# Patient Record
Sex: Female | Born: 1960 | Race: White | Hispanic: No | Marital: Married | Smoking: Never smoker
Health system: Southern US, Community
[De-identification: ages and names within clinical notes are randomized; demographics above are authoritative.]

## PROBLEM LIST (undated history)

## (undated) DIAGNOSIS — Z9889 Other specified postprocedural states: Secondary | ICD-10-CM

## (undated) DIAGNOSIS — N838 Other noninflammatory disorders of ovary, fallopian tube and broad ligament: Secondary | ICD-10-CM

## (undated) DIAGNOSIS — I10 Essential (primary) hypertension: Secondary | ICD-10-CM

## (undated) DIAGNOSIS — E7800 Pure hypercholesterolemia, unspecified: Secondary | ICD-10-CM

## (undated) DIAGNOSIS — E079 Disorder of thyroid, unspecified: Secondary | ICD-10-CM

## (undated) HISTORY — DX: Pure hypercholesterolemia, unspecified: E78.00

## (undated) HISTORY — DX: Disorder of thyroid, unspecified: E07.9

## (undated) HISTORY — DX: Essential (primary) hypertension: I10

## (undated) HISTORY — DX: Other specified postprocedural states: Z98.890

## (undated) HISTORY — DX: Other noninflammatory disorders of ovary, fallopian tube and broad ligament: N83.8

## (undated) HISTORY — PX: JOINT REPLACEMENT: SHX530

## (undated) HISTORY — PX: PILONIDAL CYST DRAINAGE: SHX743

---

## 1998-04-22 ENCOUNTER — Other Ambulatory Visit: Admission: RE | Admit: 1998-04-22 | Discharge: 1998-04-22 | Payer: Self-pay | Admitting: Gynecology

## 1999-06-15 ENCOUNTER — Other Ambulatory Visit: Admission: RE | Admit: 1999-06-15 | Discharge: 1999-06-15 | Payer: Self-pay | Admitting: Gynecology

## 2000-04-04 ENCOUNTER — Encounter: Payer: Self-pay | Admitting: Gynecology

## 2000-04-04 ENCOUNTER — Ambulatory Visit (HOSPITAL_COMMUNITY): Admission: RE | Admit: 2000-04-04 | Discharge: 2000-04-04 | Payer: Self-pay | Admitting: Gynecology

## 2000-06-15 ENCOUNTER — Other Ambulatory Visit: Admission: RE | Admit: 2000-06-15 | Discharge: 2000-06-15 | Payer: Self-pay | Admitting: Gynecology

## 2000-07-26 ENCOUNTER — Encounter: Payer: Self-pay | Admitting: Family Medicine

## 2000-07-26 ENCOUNTER — Encounter: Admission: RE | Admit: 2000-07-26 | Discharge: 2000-07-26 | Payer: Self-pay | Admitting: Family Medicine

## 2001-06-17 ENCOUNTER — Other Ambulatory Visit: Admission: RE | Admit: 2001-06-17 | Discharge: 2001-06-17 | Payer: Self-pay | Admitting: Gynecology

## 2002-07-03 ENCOUNTER — Other Ambulatory Visit: Admission: RE | Admit: 2002-07-03 | Discharge: 2002-07-03 | Payer: Self-pay | Admitting: Gynecology

## 2002-12-20 ENCOUNTER — Emergency Department (HOSPITAL_COMMUNITY): Admission: AD | Admit: 2002-12-20 | Discharge: 2002-12-20 | Payer: Self-pay | Admitting: Emergency Medicine

## 2002-12-20 ENCOUNTER — Encounter: Payer: Self-pay | Admitting: Emergency Medicine

## 2003-04-10 ENCOUNTER — Encounter: Admission: RE | Admit: 2003-04-10 | Discharge: 2003-04-10 | Payer: Self-pay | Admitting: Gynecology

## 2003-04-10 ENCOUNTER — Encounter: Payer: Self-pay | Admitting: Gynecology

## 2003-07-27 ENCOUNTER — Other Ambulatory Visit: Admission: RE | Admit: 2003-07-27 | Discharge: 2003-07-27 | Payer: Self-pay | Admitting: Gynecology

## 2004-04-06 ENCOUNTER — Encounter: Admission: RE | Admit: 2004-04-06 | Discharge: 2004-04-06 | Payer: Self-pay | Admitting: Gynecology

## 2004-08-01 ENCOUNTER — Other Ambulatory Visit: Admission: RE | Admit: 2004-08-01 | Discharge: 2004-08-01 | Payer: Self-pay | Admitting: Gynecology

## 2004-08-17 HISTORY — PX: HIP SURGERY: SHX245

## 2004-09-14 ENCOUNTER — Inpatient Hospital Stay (HOSPITAL_COMMUNITY): Admission: RE | Admit: 2004-09-14 | Discharge: 2004-09-17 | Payer: Self-pay | Admitting: Orthopedic Surgery

## 2004-11-09 ENCOUNTER — Other Ambulatory Visit: Admission: RE | Admit: 2004-11-09 | Discharge: 2004-11-09 | Payer: Self-pay | Admitting: Gynecology

## 2005-04-20 ENCOUNTER — Encounter: Admission: RE | Admit: 2005-04-20 | Discharge: 2005-04-20 | Payer: Self-pay | Admitting: Gynecology

## 2005-06-19 DIAGNOSIS — N838 Other noninflammatory disorders of ovary, fallopian tube and broad ligament: Secondary | ICD-10-CM

## 2005-06-19 HISTORY — DX: Other noninflammatory disorders of ovary, fallopian tube and broad ligament: N83.8

## 2005-12-18 ENCOUNTER — Other Ambulatory Visit: Admission: RE | Admit: 2005-12-18 | Discharge: 2005-12-18 | Payer: Self-pay | Admitting: Gynecology

## 2006-03-19 DIAGNOSIS — Z9889 Other specified postprocedural states: Secondary | ICD-10-CM

## 2006-03-19 HISTORY — DX: Other specified postprocedural states: Z98.890

## 2006-04-18 ENCOUNTER — Encounter (INDEPENDENT_AMBULATORY_CARE_PROVIDER_SITE_OTHER): Payer: Self-pay | Admitting: Specialist

## 2006-04-18 ENCOUNTER — Ambulatory Visit (HOSPITAL_COMMUNITY): Admission: RE | Admit: 2006-04-18 | Discharge: 2006-04-18 | Payer: Self-pay | Admitting: Gynecology

## 2006-04-18 HISTORY — PX: HYSTEROSCOPY: SHX211

## 2006-04-18 HISTORY — PX: ENDOMETRIAL ABLATION: SHX621

## 2006-05-01 ENCOUNTER — Encounter: Admission: RE | Admit: 2006-05-01 | Discharge: 2006-05-01 | Payer: Self-pay | Admitting: Gynecology

## 2006-12-20 ENCOUNTER — Other Ambulatory Visit: Admission: RE | Admit: 2006-12-20 | Discharge: 2006-12-20 | Payer: Self-pay | Admitting: Gynecology

## 2006-12-27 ENCOUNTER — Encounter: Admission: RE | Admit: 2006-12-27 | Discharge: 2006-12-27 | Payer: Self-pay | Admitting: Family Medicine

## 2007-05-03 ENCOUNTER — Encounter: Admission: RE | Admit: 2007-05-03 | Discharge: 2007-05-03 | Payer: Self-pay | Admitting: Gynecology

## 2007-05-09 ENCOUNTER — Encounter: Admission: RE | Admit: 2007-05-09 | Discharge: 2007-05-09 | Payer: Self-pay | Admitting: Gynecology

## 2007-05-21 ENCOUNTER — Encounter: Admission: RE | Admit: 2007-05-21 | Discharge: 2007-05-21 | Payer: Self-pay | Admitting: Gynecology

## 2007-06-20 DIAGNOSIS — N838 Other noninflammatory disorders of ovary, fallopian tube and broad ligament: Secondary | ICD-10-CM | POA: Insufficient documentation

## 2007-12-27 ENCOUNTER — Other Ambulatory Visit: Admission: RE | Admit: 2007-12-27 | Discharge: 2007-12-27 | Payer: Self-pay | Admitting: Gynecology

## 2008-05-04 ENCOUNTER — Encounter: Admission: RE | Admit: 2008-05-04 | Discharge: 2008-05-04 | Payer: Self-pay | Admitting: Gynecology

## 2008-05-18 ENCOUNTER — Encounter: Admission: RE | Admit: 2008-05-18 | Discharge: 2008-05-18 | Payer: Self-pay | Admitting: Gynecology

## 2008-06-25 ENCOUNTER — Ambulatory Visit: Payer: Self-pay | Admitting: Gynecology

## 2008-11-17 ENCOUNTER — Encounter: Admission: RE | Admit: 2008-11-17 | Discharge: 2008-11-17 | Payer: Self-pay | Admitting: Gynecology

## 2008-12-28 ENCOUNTER — Encounter: Payer: Self-pay | Admitting: Gynecology

## 2008-12-28 ENCOUNTER — Other Ambulatory Visit: Admission: RE | Admit: 2008-12-28 | Discharge: 2008-12-28 | Payer: Self-pay | Admitting: Gynecology

## 2008-12-28 ENCOUNTER — Ambulatory Visit: Payer: Self-pay | Admitting: Gynecology

## 2008-12-30 ENCOUNTER — Ambulatory Visit: Payer: Self-pay | Admitting: Gynecology

## 2009-05-05 ENCOUNTER — Encounter: Admission: RE | Admit: 2009-05-05 | Discharge: 2009-05-05 | Payer: Self-pay | Admitting: Gynecology

## 2010-01-04 ENCOUNTER — Ambulatory Visit: Payer: Self-pay | Admitting: Gynecology

## 2010-01-04 ENCOUNTER — Other Ambulatory Visit: Admission: RE | Admit: 2010-01-04 | Discharge: 2010-01-04 | Payer: Self-pay | Admitting: Gynecology

## 2010-01-17 ENCOUNTER — Ambulatory Visit: Payer: Self-pay | Admitting: Gynecology

## 2010-05-06 ENCOUNTER — Encounter: Admission: RE | Admit: 2010-05-06 | Discharge: 2010-05-06 | Payer: Self-pay | Admitting: Gynecology

## 2010-11-04 NOTE — H&P (Signed)
Mary Hughes, BUTZER              ACCOUNT NO.:  [id number]   MEDICAL RECORD NO.:  [id number]          PATIENT TYPE:  AMB   LOCATION:  SDC                           FACILITY:  [facility]   PHYSICIAN:  Timothy P. Fontaine, M.D.DATE OF BIRTH:  [birth year]   DATE OF ADMISSION:  04/18/2006  DATE OF DISCHARGE:                                HISTORY & PHYSICAL   CHIEF COMPLAINT:  Menorrhagia.   HISTORY OF PRESENT ILLNESS:  A 50 year old G2, P2, female, vasectomy birth  control, who presents complaining of increasing menorrhagia that is becoming  unacceptable.  Outpatient evaluation includes a normal thyroid, hemoglobin  of 12, and a sonohysterogram which reveals an endometrial polyp, possibly  two endometrial polyps.  The patient is admitted at this time for  hysteroscopy, removal of her polyps, D&C, endometrial ablation, NovaSure  technique.   PAST MEDICAL HISTORY:  Premenstrual anxiety.   PAST SURGICAL HISTORY:  1. Left hip replacement March 2006.  2. Pilonidal cyst x4.   MEDICATIONS:  Sarafem 10 mg daily, multivitamin.   ALLERGIES:  No medications.   REVIEW OF SYSTEMS:  Noncontributory.   FAMILY HISTORY:  Noncontributory.   SOCIAL HISTORY:  Noncontributory.   PHYSICAL EXAMINATION:  VITAL SIGNS:  Afebrile.  Vital signs are stable.  HEENT: Normal.  LUNGS:  Lungs: Clear.  CARDIAC:  Cardiac: Regular rate.  No rubs, murmurs or gallops.  ABDOMEN:  Benign.  PELVIC:  External, BUS, vagina normal.  Cervix normal.  Uterus normal size,  midline mobile, nontender.  Adnexa without masses or tenderness.   ASSESSMENT AND PLAN:  A 50 year old G2, P2, female, vasectomy birth control,  increasing menorrhagia, endometrial polyps, for hysteroscopic resection,  removal of her polyps D&C, NovaSure endometrial ablation.  Options for  removal of the polyps, monitoring her cycles, following clinically  afterwards versus going ahead with the ablation at this time were reviewed,  the various scenarios  discussed.  The patient understands that if the polyps  show significant atypia that we may need to proceed with more involved  surgery following the procedure to include hysterectomy or re-sampling, and  the patient is comfortable with this and she wants to proceed with ablation  realizing we do not have the pathology back yet at the time of that  procedure.  The long-term issues with ablation were reviewed.  She  understands she should never achieve pregnancy following the procedure and  that she needs to use absolute contraception following the procedure.  She  does have a hip replacement and does use amoxicillin antibiotic  prophylactically dental work.  We are going to go ahead and cover her with  IV ampicillin preop, and she will take a postoperative 6-hour amoxicillin 2  g dose.  The procedure was discussed to include the use of the hysteroscope,  resectoscope, D&C, and the use of the NovaSure endometrial ablation  equipment.  She understands there are no guarantees as far as bleeding  relief, that her menorrhagia may continue, change or worsen following the  procedure, and she understands and accepts this.  The acute risks to include  infection, prolonged antibiotics, hemorrhage  necessitating transfusion and  the risks of transfusion including transfusion reaction, hepatitis, HIV, mad  cow disease and other unknown entities, was all discussed, understood and  accepted.  The risk of inadvertent injury to internal organs, both through  direct injury and uterine perforation as well as transuterine thermal damage  due to the NovaSure causing internal organ damage, including bowel, bladder,  ureters, vessels and nerves, necessitating major exploratory or reparative  surgeries, future reparative surgeries including ostomy formation, was all  discussed, understood and accepted.  The use of a distention medium with the  hysteroscopy was discussed and the possibilities of absorption,  metabolic  complications, coma, seizure, were all reviewed with her.  The patient's  questions were answered to her satisfaction.  She is ready to proceed with  surgery.      Timothy P. Fontaine, M.D.  Electronically Signed     TPF/MEDQ  D:  04/11/2006  T:  04/12/2006  Job:  [id number]

## 2010-11-04 NOTE — Discharge Summary (Signed)
NAMENATISHA, TRZCINSKI NO.:  [id number]   MEDICAL RECORD NO.:  [id number]          PATIENT TYPE:  INP   LOCATION:  0468                         FACILITY:  [facility]   PHYSICIAN:  Ollen Gross, M.D.    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   DISCHARGE DIAGNOSES:  1.  Dysplasia with secondary osteoarthritis left hip, status post left total      hip arthroplasty metal-on-metal construct.  2.  Postoperative blood loss anemia, did not require transfusion.  3.  Postoperative hyponatremia.  4.  Postoperative hypokalemia.   PROCEDURE:  September 14, 2004, left total hip arthroplasty metal-on-metal  construct.   SURGEON:  Ollen Gross, M.D.   ASSISTANT:  Alexzandrew L. Julien Girt, P.A.   ANESTHESIA:  General.   BLOOD LOSS:  Was 300 cc.   Hemovac drain x1.   BRIEF HISTORY:  Mary Hughes is a 50 year old female with dysplasia of the left hip  that has gone on to develop severe end stage arthritis.  She had a bone-on-  bone throughout the hips, failed nonoperative management, now presents for a  total hip arthroplasty.   CONSULTATIONS:  None.   LABORATORY DATA:  CBC, on admission, hemoglobin 12.5, hematocrit 37.2, white  cell count 6.5, differential within normal limits.  Hemoglobin, post-op,  8.8, went down to 8.4 but came back up to 8.8 and a crit of 25.  PT and PTT  pre-op 13 and 29, respectively, with an INR of 1.  Serial protimes followed.  Last PT and INR 19.3 and 2.  Chem panel, on admission, all within normal  limits with the exception of total bilirubin elevated mildly at 1.3.  Sodium  did drop from 138 to 134, last noted at 131.  Potassium dropped from 4.1 to  3.3, back up to 3.8.  Glucose went up from 98 to 128, back down to 111.  Urinalysis, pre-op, negative.  Blood type O positive.  Left  hip films, September 12, 2004, advanced degenerative osteoarthritic change, slight lateral  subluxation of the femoral head, underlying developmental dysplasia.  Two-  view chest, September 12, 2004, no active disease.  Postoperative hip/pelvis  films, September 14, 2004, hardware is well aligned.  No perioperative  complications.  Status post left total hip.   HOSPITAL COURSE:  Admitted to [facility] and underwent above  procedure without complication, tolerated procedure well, placed on PCA and  p.o. analgesics postoperatively, doing fairly well on day one.  Initially  the patient was told there was no pain pill ordered and she was recommended  using the PCA, but Percocet was checked off.  It was not added to the [facility].  This was added for postoperative pain control.  I encouraged her to use p.o.  meds.  She had a little bit of positive volume overload but she was young  and healthy and felt to diurese the fluid well.  Hemoglobin was 8.8  postoperatively but she was asymptomatic with the anemia.  The drain was  pulled.  On day two, the hemoglobin was a little bit lower at 8.4 but she  remained asymptomatic, had a little bit of a drop in her potassium level.  She was given potassium supplements and she responded well.  PCA and IVs  were discontinued on day two.  She did well with her physical therapy  ambulating approximately 100 feet by day two, did so well she was feeling  much better by day three.  On September 17, 2004, she was doing well with her  therapy and was discharged home.   DISCHARGE PLAN:  1.  The patient is discharged home on September 17, 2004.  2.  Discharge diagnoses, please see above.  3.  Discharge meds Percocet, Robaxin, Coumadin.  4.  Diet:  As tolerated.  5.  Activity:  Partial weightbearing 25-50% to the left lower extremity.      Gait training, ambulation, ADLs.  Home health PT and home health      nursing.  Total hip protocol.  Hip precautions.  6.  Follow up two weeks  from surgery, call the office for an appointment [phone]      [phone].   DISPOSITION:  Home.   CONDITION ON DISCHARGE:  Improved.      ALP/MEDQ  D:  10/26/2004  T:  10/26/2004  Job:  [id number]   cc:   Ollen Gross, M.D.  [facility]  [address]  [address]  [place]  [place] [address]  Fax: [phone]   Mary Hughes, M.D.  [address] [place]  [place] [address]  Fax: [phone]

## 2010-11-04 NOTE — Op Note (Signed)
NAME:  Mary Hughes, Mary Hughes NO.:  [id number]   MEDICAL RECORD NO.:  [id number]          PATIENT TYPE:  INP   LOCATION:  0001                         FACILITY:  [facility]   PHYSICIAN:  Ollen Gross, M.D.    DATE OF BIRTH:  Feb 20, 1961   DATE OF PROCEDURE:  09/14/2004  DATE OF DISCHARGE:                                 OPERATIVE REPORT   PREOPERATIVE DIAGNOSIS:  Dysplasia with secondary osteoarthritis, left hip.   POSTOPERATIVE DIAGNOSIS:  Dysplasia with second osteoarthritis, left hip.   PROCEDURE:  Left total hip arthroplasty, metal-on-metal.   SURGEON:  Ollen Gross, M.D.   ASSISTANT:  Alexzandrew L. Julien Girt, P.A.   ANESTHESIA:  General.   ESTIMATED BLOOD LOSS:  300.   DRAIN:  Hemovac x1.   COMPLICATIONS:  None.   CONDITION:  Stable to the recovery room.   BRIEF CLINICAL NOTE:  Mary Hughes is a 50 year old female who has dysplasia of the  left hip and has gone on to develop severe end-stage osteoarthritic changes.  She has bone-on-bone throughout the hip.  She has failed nonoperative  management and presents now for left total hip arthroplasty.   PROCEDURE IN DETAIL:  After successful administration of general anesthetic,  the patient was placed in the right lateral decubitus position with the left  side up and held with the hip positioner.  The left lower extremity was  isolated with a plastic drapes and prepped and draped and draped in the  usual sterile fashion.  A short posterolateral incision was made with a 10  blade through the subcutaneous tissue to the level of the fascia lata which  was incised in line with the skin incision.  The sciatic nerve was palpated  an protected, and the short rotators isolated off the femur.  Capsulectomy  was performed, and the hip was dislocated.  The center of the femoral head  was marked, and trial prosthesis placed such that the center of the trial  head corresponds to the center of her native femoral head.  The  osteotomy  midline was marked on the femoral neck and osteotomy made with an  oscillating saw.  The femoral head was removed and then the femur retracted  anteriorly to gain acetabular exposure.   Acetabular reaming starts at 47, coursing in increments of 2 up to a 53, and  then a 54-mm Pinnacle acetabular shell was placed in anatomic position and  transfixed with two dome screws.  A trial 36-mm neutral liner was placed.   The femur was prepared first with the canal finder and then irrigation.  Axial reaming was performed to 13.5 mm, then proximal reaming to a 18-D and  a sleeve machine to a small.  An 18-D small trial sleeve was placed with an  18 x 13 stem at 36+8 neck.  Her anteversion was about 40 degrees.  We  actually retroverted her in relation to her native anatomy to get a version  of approximately 25 degrees.  We placed a 36+0 head and with the +0, she had  a little bit too much soft tissue laxity, so we went  to a 36+3.  With the  +3, she had full extension, full external rotation to 70 degrees, flexion to  40 degrees, adduction to 90 degrees, internal rotation to 90 degrees flexion  and 90 degrees internal rotation.  By placing the left log on top of the  right, the leg lengths were found to be equal.  The hip was then dislocated,  and all trials were removed.  The permanent Apex hole eliminator was placed  into the acetabular shell, and a permanent 36-mm neutral Ultamet metal liner  is placed for a metal-on-metal hip replacement.  The 18-D small sleeve was  placed into the proximal femur with the 18 x 13 stem and a 36+8 neck,  version matching the version of the trial.  The 36+3 head was then placed,  and the hip was reduced with the same stability parameters.  The wound was  copiously irrigated with saline solution, and the short external rotators  were reattached to the femur through drill holes.  The fascia lata was  closed over a Hemovac drain with interrupted #1 Vicryl,  subcutaneous tissue  closed with #1 and 2-0 Vicryl and subcuticular running 4-0 Monocryl.  Thirty  cc of 0.25% Marcaine were injected into the subcutaneous tissues.  Steri-  Strips and a bulky sterile dressing were applied.  The drain was hooked to  suction.  She was placed into a knee immobilizer, awakened, and transported  to recovery in stable condition.      FA/MEDQ  D:  09/14/2004  T:  09/14/2004  Job:  [id number]

## 2010-11-04 NOTE — H&P (Signed)
NAME:  Mary Hughes, Mary Hughes NO.:  [id number]   MEDICAL RECORD NO.:  [id number]          PATIENT TYPE:  INP   LOCATION:  NA                           FACILITY:  [facility]   PHYSICIAN:  Ollen Gross, M.D.    DATE OF BIRTH:  1960/11/27   DATE OF ADMISSION:  09/14/2004  DATE OF DISCHARGE:                                HISTORY & PHYSICAL   Date of examination:  September 06, 2004.   CHIEF COMPLAINT:  Left hip pain.   HISTORY OF PRESENT ILLNESS:  The patient is a 50 year old female who has  been seen by Dr. Lequita Halt for ongoing left hip pain.  She has known hip  arthritis that has been progressing for quite some time now.  She was  originally diagnosed with congenital hip dysplasia and she has gone on to  develop severe arthritis secondary to her dysplastic hip.  She was seen in  the office for x-rays.  Unfortunately she had bone on bone arthritic changes  with subchondral cystic formation of the femoral head.  This is more  advanced on films compared to last year.  Due to the progression, it was  felt she would benefit from undergoing surgical intervention.  The risks and  benefits were discussed.  The patient was subsequently admitted to the  hospital.   ALLERGIES:  No known drug allergies.   CURRENT MEDICATIONS:  None.   PAST MEDICAL HISTORY:  Negative other than arthritis.   PAST SURGICAL HISTORY:  1.  Parotid cyst, left gland, surgery x4.  2.  Left eye surgery.   SOCIAL HISTORY:  Married.  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  Nonsmoker.  Alcohol:  Approximately two drinks per week.  Has two children.   FAMILY HISTORY:  Mother living at age 92 with hypertension, previous smoker.  Father living at 4 with a heart condition and cardiomyopathy.  Maternal  grandmother with diabetes.   REVIEW OF SYSTEMS:  GENERAL:  No fevers, chills, or night sweats.  NEUROLOGIC:  No seizures or paralysis.  RESPIRATORY:  No shortness of breath  or productive cough or hemoptysis.   CARDIOVASCULAR:  No chest pain, angina  or orthopnea.  GI:  No nausea, vomiting, diarrhea, or constipation.  GU: No  dysuria, hematuria, or discharge.  MUSCULOSKELETAL:  Left hip, see history  of present illness.   PHYSICAL EXAMINATION:  VITAL SIGNS:  Pulse 76, respirations 12, blood  pressure 120/76.  GENERAL:  A 50 year old white female, well-nourished, well-developed in no  acute distress.  Alert, oriented, cooperative, pleasant at the time of the  exam.  Appears to be an excellent historian.  HEENT:  Normocephalic, atraumatic.  Pupils are equal, round and reactive.  EOMs are intact.  NECK:  Supple.  No carotid bruits are appreciated.  CHEST:  Clear anterior and proximal chest wall.  No rhonchi, rales or  wheezing.  HEART:  Regular rate and rhythm.  No murmurs.  S1 and S2 noted.  ABDOMEN:  Soft, nontender.  Bowel sounds present.  RECTAL/BREASTS/GENITALIA:  Not done and not pertinent to present illness.  EXTREMITIES:  Left hip shows  flexion of about 110 degrees, abduction of  about 30, internal rotation of about 20, external rotation about 30.  The  left leg is about 3/8-inch shorter as compared to the right.   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   PLAN:  The patient admitted to [facility] to undergo a left total  hip arthroplasty.  Surgery will be performed by Dr. Ollen Gross.      ALP/MEDQ  D:  09/13/2004  T:  09/14/2004  Job:  [id number]   cc:   Bryan Lemma. Manus Gunning, M.D.  [address] [place]  [place] [address]  Fax: [phone]

## 2010-11-04 NOTE — Op Note (Signed)
[name], Mary Hughes              ACCOUNT NO.:  [id number]   MEDICAL RECORD NO.:  [id number]          PATIENT TYPE:  AMB   LOCATION:  SDC                           FACILITY:  [facility]   PHYSICIAN:  Timothy P. Fontaine, M.D.DATE OF BIRTH:  April 11, 1961   DATE OF PROCEDURE:  04/18/2006  DATE OF DISCHARGE:                                 OPERATIVE REPORT   PREOPERATIVE DIAGNOSES:  Menorrhagia, rule out endometrial polyp.   POSTOPERATIVE DIAGNOSES:  Menorrhagia.   PROCEDURE:  Hysteroscopy D&C, Novasure endometrial ablation.   SURGEON:  Timothy P. Fontaine, M.D.   ANESTHETIC:  General with 1% lidocaine paracervical block.   ESTIMATED BLOOD LOSS:  Minimal.   SORBITOL DISCREPANCY:  75 mL.   SPECIMEN:  1. Posterior wall endometrial curetting.  2. General endometrial curetting.   COMPLICATIONS:  None.   FINDINGS:  EUA external B U S vagina normal.  Cervix normal. uterus normal  size, midline mobile, anteverted, normal shape and contour.  Adnexa without  masses.  Hysteroscopic is adequate noting fundus posterior and anterior  uterine surfaces, lower uterine segment endocervical canal, right and left  tubal ostia all visualized.  There was a thickening of the endometrium on  the posterior wall, no definitive polyp. Novasure settings are noted.  Sounding length 8.5 cm, cervical length 4.0, cavity lengths 4.5, cavity  width 4.25, power 104, time 1 minute 45 seconds.   PROCEDURE:  The patient was taken to the operating room, underwent general  anesthesia, was placed low dorsal lithotomy position, received a perineal  vaginal preparation with Betadine solution.  Bladder emptied with in-and-out  Foley catheterization per nursing personnel.  EUA performed.  The patient  draped in usual fashion.  Cervix visualized with a speculum, anterior lip  grasped with single-tooth tenaculum and a paracervical block using 1%  lidocaine total of 10 mL was placed.  Cavity length and cervical length were  then determined and using the operative hysteroscope hysteroscopy was  performed requiring no further dilatation.  There was a generalized  thickening of the posterior wall endometrium.  No definitive polyp.  The  sharp curettage posteriorly was performed.  The specimen was sent separately  following which the generalized curettage was performed and again the  specimen was sent to pathology separately.  Switching from sorbitol to  saline for the Novasure ablation, the cavity was irrigated copiously to  remove the sorbitol distendent and subsequently the Novasure instrument was  placed within the cavity and engaged.  The instrument was manipulated to  assure appropriate placement, cavity width determined, carbon dioxide test  was performed and passed and subsequently the ablation was performed without  difficulty.  Rehysteroscopy showed a good uniform treatment of the cavity,  good distension, no evidence of perforation.  The instruments were removed.  Adequate hemostasis was visualized.  The patient placed in supine position  awakened  without difficulty, taken to recovery room in good condition having  tolerated the procedure well.  The patient due to her artificial hip  received antibiotic prophylaxis preoperatively and was instructed along with  her husband to take her postoperative antibiotic dose  for prophylaxis.      Timothy P. Fontaine, M.D.  Electronically Signed     TPF/MEDQ  D:  04/18/2006  T:  04/18/2006  Job:  [id number]

## 2011-01-06 ENCOUNTER — Encounter (INDEPENDENT_AMBULATORY_CARE_PROVIDER_SITE_OTHER): Payer: BC Managed Care – PPO | Admitting: Gynecology

## 2011-01-06 ENCOUNTER — Other Ambulatory Visit: Payer: Self-pay | Admitting: Gynecology

## 2011-01-06 ENCOUNTER — Other Ambulatory Visit (HOSPITAL_COMMUNITY)
Admission: RE | Admit: 2011-01-06 | Discharge: 2011-01-06 | Disposition: A | Discharge status: Home / Self Care | Payer: BC Managed Care – PPO | Source: Ambulatory Visit | Attending: Gynecology | Admitting: Gynecology

## 2011-01-06 DIAGNOSIS — Z124 Encounter for screening for malignant neoplasm of cervix: Secondary | ICD-10-CM | POA: Insufficient documentation

## 2011-01-06 DIAGNOSIS — R82998 Other abnormal findings in urine: Secondary | ICD-10-CM

## 2011-01-06 DIAGNOSIS — Z01419 Encounter for gynecological examination (general) (routine) without abnormal findings: Secondary | ICD-10-CM

## 2011-01-06 DIAGNOSIS — Z13220 Encounter for screening for lipoid disorders: Secondary | ICD-10-CM

## 2011-01-06 DIAGNOSIS — Z833 Family history of diabetes mellitus: Secondary | ICD-10-CM

## 2011-01-09 NOTE — Progress Notes (Signed)
CHART ON YOUR DESK. THANKS

## 2011-01-14 ENCOUNTER — Telehealth: Payer: Self-pay | Admitting: Gynecology

## 2011-01-14 DIAGNOSIS — B373 Candidiasis of vulva and vagina: Secondary | ICD-10-CM

## 2011-01-14 MED ORDER — FLUCONAZOLE 150 MG PO TABS: 150.0000 mg | ORAL_TABLET | Freq: Once | ORAL | Status: AC

## 2011-01-14 NOTE — Telephone Encounter (Signed)
Tell patient that she had yeast on her Pap smear and I would treat her with Diflucan 150x1 dose the remainder of her labs were all normal  I escribed to her pharmacy the medication

## 2011-01-16 NOTE — Telephone Encounter (Signed)
PT INFORMED WITH THE BELOW NOTE. 

## 2011-01-17 ENCOUNTER — Other Ambulatory Visit: Payer: Self-pay | Admitting: *Deleted

## 2011-01-17 MED ORDER — FLUCONAZOLE 150 MG PO TABS: 150.0000 mg | ORAL_TABLET | Freq: Once | ORAL | Status: AC

## 2011-03-29 ENCOUNTER — Other Ambulatory Visit: Payer: Self-pay | Admitting: Gynecology

## 2011-03-29 DIAGNOSIS — Z1231 Encounter for screening mammogram for malignant neoplasm of breast: Secondary | ICD-10-CM

## 2011-03-30 ENCOUNTER — Ambulatory Visit: Payer: BC Managed Care – PPO | Admitting: Sports Medicine

## 2011-05-08 ENCOUNTER — Ambulatory Visit
Admission: RE | Admit: 2011-05-08 | Discharge: 2011-05-08 | Disposition: A | Discharge status: Home / Self Care | Payer: BC Managed Care – PPO | Source: Ambulatory Visit | Attending: Gynecology | Admitting: Gynecology

## 2011-05-08 DIAGNOSIS — Z1231 Encounter for screening mammogram for malignant neoplasm of breast: Secondary | ICD-10-CM

## 2011-06-27 ENCOUNTER — Encounter: Payer: Self-pay | Admitting: Gynecology

## 2011-06-27 ENCOUNTER — Ambulatory Visit: Payer: BC Managed Care – PPO | Admitting: Gynecology

## 2011-06-27 ENCOUNTER — Other Ambulatory Visit: Payer: Self-pay | Admitting: Gynecology

## 2011-06-27 ENCOUNTER — Other Ambulatory Visit: Payer: BC Managed Care – PPO

## 2011-06-27 ENCOUNTER — Ambulatory Visit (INDEPENDENT_AMBULATORY_CARE_PROVIDER_SITE_OTHER): Payer: BC Managed Care – PPO | Admitting: Gynecology

## 2011-06-27 ENCOUNTER — Ambulatory Visit (INDEPENDENT_AMBULATORY_CARE_PROVIDER_SITE_OTHER): Payer: BC Managed Care – PPO

## 2011-06-27 DIAGNOSIS — N839 Noninflammatory disorder of ovary, fallopian tube and broad ligament, unspecified: Secondary | ICD-10-CM

## 2011-06-27 DIAGNOSIS — D3910 Neoplasm of uncertain behavior of unspecified ovary: Secondary | ICD-10-CM

## 2011-06-27 DIAGNOSIS — N838 Other noninflammatory disorders of ovary, fallopian tube and broad ligament: Secondary | ICD-10-CM

## 2011-06-27 DIAGNOSIS — N644 Mastodynia: Secondary | ICD-10-CM

## 2011-06-27 NOTE — Patient Instructions (Signed)
If breast pain continues call for further evaluation. Otherwise if poorly resolves then follow up at your next annual exam.

## 2011-06-27 NOTE — Progress Notes (Signed)
Patient presents with 2 issues: 1. Follow up ultrasound for persistent right ovarian echogenic mass. Ultrasound today shows endometrial echo 4.9 mm. Normal uterine myometrial echotexture. Left ovary normal cul-de-sac negative. Right ovary with continued presence of solid mass with calcifications 18 mm mean. In review of her ultrasounds this area was present at the same size since 2007. I reviewed with her that this could be a small dermoid, fibroma, endometriosis or other pathology. Regardless it has remained unchanged for 6 years in follow up and is very unlikely that this is a malignant process. She's comfortable with observation. I not sure if we need to reultrasound her at a time in the future and we'll readdress this on an annual basis when she comes in for her annual exams. 2. Left breast discomfort. Patient notes some left lateral breast and chest wall discomfort and burning for about 3 weeks. No nipple discharge or definitive masses palpated. She just had her screening mammogram in November which was normal. In review of her records she had an area of left periareolar to 6:00 asymmetry on mammogram ultimately had an MRI which was negative. Her follow up mammogram in November 2009 questions a possible mass in the outer left breast for which she had an ultrasound/diagnostic mammogram which was negative and a short interval follow up mammogram June 2010 which again was negative. Her mammogram last year and this year were normal.  Exam today with Sherri chaperone present Both breast examined lying and sitting without masses retractions discharge adenopathy. The area on the left where she is pointing was vigorously examined and no palpable or visual abnormalities were noted.  Discussed options with patient. With recent mammogram negative and exam negative recommended monitoring over the next several weeks. If her pain burning sensation totally disappears then we'll follow. If it persists then I asked her  to call me and that we will pursue more involved evaluation to consider diagnostic mammography and or ultrasound. Patient is comfortable with the plan and again if this pain persists, she knows to call me or certainly if she feels any abnormalities. If it totally resolves then we'll follow.

## 2011-07-11 ENCOUNTER — Telehealth: Payer: Self-pay | Admitting: *Deleted

## 2011-07-11 DIAGNOSIS — N644 Mastodynia: Secondary | ICD-10-CM

## 2011-07-11 NOTE — Telephone Encounter (Signed)
I would recommend diagnostic mammogram on the left and ultrasound for persistent pain.  Schedule this for her.

## 2011-07-11 NOTE — Telephone Encounter (Signed)
Pt was seen on 06/27/11 for breast pain, pt was told to call if symptoms are not resolves. Pt said burning sensation is no longer there, but still discomfort. Please advise

## 2011-07-12 NOTE — Telephone Encounter (Signed)
LEFT THE BELOW NOTE ON PT VM THAT [facility] WILL CONTACT HER FOR APPOINTMENT. ORDER PLACED IN COMPUTER.

## 2011-07-14 ENCOUNTER — Ambulatory Visit
Admission: RE | Admit: 2011-07-14 | Discharge: 2011-07-14 | Disposition: A | Discharge status: Home / Self Care | Payer: BC Managed Care – PPO | Source: Ambulatory Visit | Attending: Gynecology | Admitting: Gynecology

## 2011-07-14 DIAGNOSIS — N644 Mastodynia: Secondary | ICD-10-CM

## 2011-10-19 ENCOUNTER — Other Ambulatory Visit: Payer: Self-pay | Admitting: Dermatology

## 2012-01-08 ENCOUNTER — Ambulatory Visit (INDEPENDENT_AMBULATORY_CARE_PROVIDER_SITE_OTHER): Payer: BC Managed Care – PPO | Admitting: Gynecology

## 2012-01-08 ENCOUNTER — Encounter: Payer: Self-pay | Admitting: Gynecology

## 2012-01-08 VITALS — BP 124/78 | Ht 66.0 in | Wt 148.0 lb

## 2012-01-08 DIAGNOSIS — Z01419 Encounter for gynecological examination (general) (routine) without abnormal findings: Secondary | ICD-10-CM

## 2012-01-08 NOTE — Patient Instructions (Signed)
Follow up in one year for annual gynecologic exam. 

## 2012-01-08 NOTE — Progress Notes (Signed)
Mary Hughes 04/17/1961 [id number]        51 y.o.  G2P2002 for annual exam.  Doing well without complaints.  Past medical history,surgical history, medications, allergies, family history and social history were all reviewed and documented in the EPIC chart. ROS:  Was performed and pertinent positives and negatives are included in the history.  Exam: Kim assistant Filed Vitals:   01/08/12 0910  BP: 124/78  Height: 5\' 6"  (1.676 m)  Weight: 148 lb (67.132 kg)   General appearance  Normal Skin grossly normal Head/Neck normal with no cervical or supraclavicular adenopathy thyroid normal Lungs  clear Cardiac RR, without RMG Abdominal  soft, nontender, without masses, organomegaly or hernia Breasts  examined lying and sitting without masses, retractions, discharge or axillary adenopathy. Pelvic  Ext/BUS/vagina  normal   Cervix  normal   Uterus  anteverted, normal size, shape and contour, midline and mobile nontender   Adnexa  Without masses or tenderness    Anus and perineum  normal   Rectovaginal  normal sphincter tone without palpated masses or tenderness.    Assessment/Plan:  51 y.o. G14P2002 female for annual exam, regular menses, vasectomy birth control.   1. Mammography.  Patient has history of left breast discomfort reevaluated her in January. This has not changed and she has new findings on self breast exam. Her exam today is normal.  Due for mammography in October. We'll follow for this. SBE monthly reviewed. As long as nothing changes she'll continue to monitor. 2. Pap smear. Last Pap smear 2012. No Pap smear done today. She has no history of abnormal Pap smears with numerous normal reports in her chart. Discussed current screening guidelines we'll plan every 3-5 your screening. 3. Right ovarian echogenic mass. Patient has small echogenic area on her right ovary that has been stable for a number of years. Last ultrasound January 2013. We'll plan repeat next year at her annual  and if stable then we'll stop screening.  See January 2013 note. 4. Health maintenance. Annual CBC, glucose, lipid profiles have all been normal with last check 2012. The blood work done this year. Assuming she continues well from a gynecologic standpoint she will see me in a year, sooner as needed.    Dara Lords MD, 9:33 AM 01/08/2012

## 2012-01-09 LAB — URINALYSIS W MICROSCOPIC + REFLEX CULTURE
Bilirubin Urine: NEGATIVE
Crystals: NONE SEEN
Glucose, UA: NEGATIVE mg/dL
Ketones, ur: NEGATIVE mg/dL
Nitrite: NEGATIVE
Protein, ur: NEGATIVE mg/dL
Specific Gravity, Urine: 1.017 (ref 1.005–1.030)
Urobilinogen, UA: 0.2 mg/dL (ref 0.0–1.0)
pH: 7.5 (ref 5.0–8.0)

## 2012-04-01 ENCOUNTER — Encounter (HOSPITAL_COMMUNITY): Payer: Self-pay | Admitting: *Deleted

## 2012-04-01 ENCOUNTER — Observation Stay (HOSPITAL_COMMUNITY)
Admission: EM | Admit: 2012-04-01 | Discharge: 2012-04-03 | Disposition: A | Discharge status: Home / Self Care | Payer: BC Managed Care – PPO | Attending: Internal Medicine | Admitting: Internal Medicine

## 2012-04-01 DIAGNOSIS — N839 Noninflammatory disorder of ovary, fallopian tube and broad ligament, unspecified: Secondary | ICD-10-CM | POA: Insufficient documentation

## 2012-04-01 DIAGNOSIS — D649 Anemia, unspecified: Secondary | ICD-10-CM

## 2012-04-01 DIAGNOSIS — K5730 Diverticulosis of large intestine without perforation or abscess without bleeding: Secondary | ICD-10-CM | POA: Insufficient documentation

## 2012-04-01 DIAGNOSIS — K922 Gastrointestinal hemorrhage, unspecified: Principal | ICD-10-CM | POA: Insufficient documentation

## 2012-04-01 DIAGNOSIS — D126 Benign neoplasm of colon, unspecified: Secondary | ICD-10-CM | POA: Insufficient documentation

## 2012-04-01 DIAGNOSIS — Z96649 Presence of unspecified artificial hip joint: Secondary | ICD-10-CM | POA: Insufficient documentation

## 2012-04-01 LAB — OCCULT BLOOD, POC DEVICE: Fecal Occult Bld: POSITIVE

## 2012-04-01 NOTE — ED Notes (Signed)
Pt reports eating bad food on Monday, experienced abd cramping and now has developed bright red rectal bleeding with dark red clots. Pt reports 6-7 episodes of rectal bleeding today.

## 2012-04-02 ENCOUNTER — Encounter (HOSPITAL_COMMUNITY): Payer: Self-pay | Admitting: *Deleted

## 2012-04-02 DIAGNOSIS — K922 Gastrointestinal hemorrhage, unspecified: Principal | ICD-10-CM

## 2012-04-02 LAB — CBC
HCT: 32.8 % — ABNORMAL LOW (ref 36.0–46.0)
Hemoglobin: 10.5 g/dL — ABNORMAL LOW (ref 12.0–15.0)
MCH: 32.4 pg (ref 26.0–34.0)
MCH: 32.4 pg (ref 26.0–34.0)
MCHC: 35.1 g/dL (ref 30.0–36.0)
Platelets: 279 10*3/uL (ref 150–400)
Platelets: 273 10*3/uL (ref 150–400)
RBC: 3.24 MIL/uL — ABNORMAL LOW (ref 3.87–5.11)
WBC: 7.1 10*3/uL (ref 4.0–10.5)
WBC: 10.5 10*3/uL (ref 4.0–10.5)

## 2012-04-02 LAB — COMPREHENSIVE METABOLIC PANEL
AST: 30 U/L (ref 0–37)
Alkaline Phosphatase: 52 U/L (ref 39–117)
CO2: 24 mEq/L (ref 19–32)
Chloride: 101 mEq/L (ref 96–112)
Creatinine, Ser: 0.65 mg/dL (ref 0.50–1.10)
GFR calc Af Amer: >90 mL/min (ref >90)

## 2012-04-02 LAB — BASIC METABOLIC PANEL
CO2: 27 mEq/L (ref 19–32)
Calcium: 8.5 mg/dL (ref 8.4–10.5)
Chloride: 106 mEq/L (ref 96–112)
Glucose, Bld: 102 mg/dL — ABNORMAL HIGH (ref 70–99)
Potassium: 3.8 mEq/L (ref 3.5–5.1)
Sodium: 138 mEq/L (ref 135–145)

## 2012-04-02 LAB — HEMOGLOBIN AND HEMATOCRIT, BLOOD: HCT: 29.2 % — ABNORMAL LOW (ref 36.0–46.0)

## 2012-04-02 LAB — HEMOGLOBIN: Hemoglobin: 10.7 g/dL — ABNORMAL LOW (ref 12.0–15.0)

## 2012-04-02 MED ORDER — INFLUENZA VIRUS VACC SPLIT PF IM SUSP
0.5000 mL | INTRAMUSCULAR | Status: AC
  Administered 2012-04-03: 0.5 mL via INTRAMUSCULAR
  Filled 2012-04-02: qty 0.5

## 2012-04-02 MED ORDER — SODIUM CHLORIDE 0.9 % IV BOLUS (SEPSIS)
1000.0000 mL | Freq: Once | INTRAVENOUS | Status: AC
  Administered 2012-04-02: 1000 mL via INTRAVENOUS

## 2012-04-02 MED ORDER — SODIUM CHLORIDE 0.9 % IV SOLN
INTRAVENOUS | Status: DC
  Administered 2012-04-02: 20 mL/h via INTRAVENOUS
  Administered 2012-04-03: 500 mL via INTRAVENOUS
  Administered 2012-04-03: 11:00:00 via INTRAVENOUS

## 2012-04-02 MED ORDER — INFLUENZA VIRUS VACC SPLIT PF IM SUSP: 0.5000 mL | INTRAMUSCULAR | Status: DC

## 2012-04-02 MED ORDER — SODIUM CHLORIDE 0.9 % IJ SOLN: 3.0000 mL | Freq: Two times a day (BID) | INTRAMUSCULAR | Status: DC

## 2012-04-02 MED ORDER — PEG 3350-KCL-NA BICARB-NACL 420 G PO SOLR
4000.0000 mL | Freq: Once | ORAL | Status: AC
  Administered 2012-04-02: 4000 mL via ORAL

## 2012-04-02 MED ORDER — SODIUM CHLORIDE 0.9 % IV SOLN: 250.0000 mL | INTRAVENOUS | Status: DC | PRN

## 2012-04-02 MED ORDER — SODIUM CHLORIDE 0.9 % IJ SOLN: 3.0000 mL | INTRAMUSCULAR | Status: DC | PRN

## 2012-04-02 NOTE — H&P (Signed)
[facility] History and Physical  Mary Hughes ZHY:[id number] DOB: 1960/12/13 DOA: 04/01/2012  Referring physician: ED PCP: Mary Lance, MD  Specialists: GI consulted and will see in AM  Chief Complaint: Rectal bleed  HPI: Mary Hughes is a 51 y.o. female who presents with c/o BRBPR onset earlier this afternoon.  She describes it as passing clots and bright red blood with her stool but not melena.  She notes this occurs in the context of some GI upset last week but no bloody stools at that time nor n/v/d.  Because of her BRBPR the patient presented to the ED, while her h/h was stable, she continued to pass blood in the ED and the ED has requested that hospitalist service admit the patient.  GI has been called by the ED and will see patient in AM.  Review of Systems: 12 systems reviewed and negative except as per HPI.  Past Medical History  Diagnosis Date  . S/P endometrial ablation 10/07  . Ovarian mass 06/2007    RIGHT OVARIAN ECHOGENIC MASS   Past Surgical History  Procedure Date  . Hysteroscopy 04/18/2006    HYST, D&C WITH NOVASURE  . Endometrial ablation 04/18/2006  . Pilonidal cyst drainage     X4  . Hip surgery 08/2004    LEFT HIP REPLACEMENT   Social History:  reports that she has never smoked. She has never used smokeless tobacco. She reports that she drinks about 3 ounces of alcohol per week. Her drug history not on file. Patient lives at home, performs all ADLs  No Known Allergies  Family History  Problem Relation Age of Onset  . Hypertension Mother   . Lung cancer Mother   . Heart disease Father   . Diabetes Maternal Grandmother   . Heart disease Maternal Grandfather     Prior to Admission medications   Medication Sig Start Date End Date Taking? Authorizing Provider  ibuprofen (ADVIL,MOTRIN) 200 MG tablet Take 600 mg by mouth every 6 (six) hours as needed.   Yes Historical Provider, MD   Physical Exam: Filed Vitals:   04/01/12 2201  04/01/12 2330  BP: 167/109 137/88  Pulse: 73 60  Temp: 98.6 F (37 C) 98.6 F (37 C)  TempSrc: Oral Oral  Resp: 18 18  SpO2: 99% 99%     General:  NAD,   Eyes: PEERLA EOMI  ENT: Moist mucous membranes  Neck: supple w/o JVD  Cardiovascular: RRR w/o MRG  Respiratory: CTA B  Abdomen: soft, nt, nd, bs+  Skin: without rash or lesion  Musculoskeletal: MAE, full ROM all 4 extremities  Psychiatric: normal tone and affect  Neurologic: AAOx3, grossly non-focal  Labs on Admission:  Basic Metabolic Panel:  Lab 04/01/12 2952  NA 136  K 3.4*  CL 101  CO2 24  GLUCOSE 98  BUN 12  CREATININE 0.65  CALCIUM 9.2  MG --  PHOS --   Liver Function Tests:  Lab 04/01/12 2244  AST 30  ALT 12  ALKPHOS 52  BILITOT 0.4  PROT 7.1  ALBUMIN 3.7   No results found for this basename: LIPASE:5,AMYLASE:5 in the last 168 hours No results found for this basename: AMMONIA:5 in the last 168 hours CBC:  Lab 04/01/12 2244  WBC 7.1  NEUTROABS --  HGB 11.5*  HCT 32.8*  MCV 92.4  PLT 279   Cardiac Enzymes: No results found for this basename: CKTOTAL:5,CKMB:5,CKMBINDEX:5,TROPONINI:5 in the last 168 hours  BNP (last 3 results) No results found  for this basename: PROBNP:3 in the last 8760 hours CBG: No results found for this basename: GLUCAP:5 in the last 168 hours  Radiological Exams on Admission: No results found.  EKG: Independently reviewed.  Assessment/Plan Principal Problem:  *GI bleed   1. GI bleed - very stable patient, no evidence to transfuse blood at this time, will monitor H/H Q8H, vitals Q6 to monitor for tachycardia.  GI formally consulted by ED and will see patient tomorrow. 2. H/o ovarian mass - being followed by GYN, size stable over past 4 years.  Code Status: Full Code Family Communication: no family in room Disposition Plan: admit to obs  Time spent: 50 min  Deshawna Mcneece M. [facility] Pager [phone]  If 7PM-7AM, please contact  night-coverage [email or web address] Password [email or web address] 04/02/2012, 2:58 AM

## 2012-04-02 NOTE — Progress Notes (Signed)
Followup note. Patient admitted earlier this morning from the emergency room. Currently she is on the floor and do well. She's not having any further rectal bleeding. No complaints. No, pain. No shortness of breath.  Cardiovascular: Regular rate and rhythm, S1-S2 Lungs: Clear to auscultation bilaterally Abdomen: Soft, nontender, nondistended, positive bowel sounds  Assessment and plan: 51 year old white female who presents with some lower GI bleeding with bright red blood per rectum now stabilized. Continue to follow hemoglobin and patient has been seen by [facility] gastroenterology. Plan is for colonoscopy tomorrow, and if okay, can be discharged home. Suspect self-contained diverticular bleeding.

## 2012-04-02 NOTE — Consult Note (Signed)
Subjective:   HPI  The patient is a 51 year old female who we are asked to see in regards to lower gastrointestinal bleeding. She started to experience some loose stools with passage of blood and clots yesterday afternoon. She continued to have multiple episodes of passing blood and clots and presented to the emergency room where she continued to pass blood and clots. She was admitted to the hospital for further evaluation. She does not complain of abdominal pain. She denies vomiting or hematemesis.  Review of Systems Denies chest pain or shortness of breath.  Past Medical History  Diagnosis Date  . S/P endometrial ablation 10/07  . Ovarian mass 06/2007    RIGHT OVARIAN ECHOGENIC MASS   Past Surgical History  Procedure Date  . Hysteroscopy 04/18/2006    HYST, D&C WITH NOVASURE  . Endometrial ablation 04/18/2006  . Pilonidal cyst drainage     X4  . Hip surgery 08/2004    LEFT HIP REPLACEMENT  . Joint replacement     left hip   History   Social History  . Marital Status: Married    Spouse Name: N/A    Number of Children: N/A  . Years of Education: N/A   Occupational History  . Not on file.   Social History Main Topics  . Smoking status: Never Smoker   . Smokeless tobacco: Never Used  . Alcohol Use: 3.0 oz/week    6 drink(s) per week  . Drug Use: Not on file  . Sexually Active: Yes    Birth Control/ Protection: Other-see comments     Vasectomy   Other Topics Concern  . Not on file   Social History Narrative  . No narrative on file   family history includes Diabetes in her maternal grandmother; Heart disease in her father and maternal grandfather; Hypertension in her mother; and Lung cancer in her mother. Current facility-administered medications:0.9 %  sodium chloride infusion, 250 mL, Intravenous, PRN, Hillary Bow, DO;  influenza  inactive virus vaccine (FLUZONE/FLUARIX) injection 0.5 mL, 0.5 mL, Intramuscular, Tomorrow-1000, Laveda Norman, MD;  sodium chloride  0.9 % bolus 1,000 mL, 1,000 mL, Intravenous, Once, Hillary Bow, DO, 1,000 mL at 04/02/12 0410;  sodium chloride 0.9 % injection 3 mL, 3 mL, Intravenous, Q12H, Hillary Bow, DO sodium chloride 0.9 % injection 3 mL, 3 mL, Intravenous, PRN, Hillary Bow, DO;  DISCONTD: influenza  inactive virus vaccine (FLUZONE/FLUARIX) injection 0.5 mL, 0.5 mL, Intramuscular, Tomorrow-1000, Laveda Norman, MD No Known Allergies   Objective:     BP 135/81  Pulse 57  Temp 98.4 F (36.9 C) (Oral)  Resp 18  Ht 5\' 6"  (1.676 m)  Wt 65.8 kg (145 lb 1 oz)  BMI 23.41 kg/m2  SpO2 100%  LMP 03/30/2012  She is alert and in no acute distress  Skin nonicteric  Neck supple  Heart regular rhythm no murmurs  Lungs clear  Abdomen: Bowel sounds are normal, soft, nontender, no hepatosplenomegaly  Laboratory No components found with this basename: d1      Assessment:     Lower gastrointestinal bleeding etiology unclear. Multiple etiologies of this could be considered.      Plan:     Colonoscopy will be planned for tomorrow. The procedure was explained in detail to the patient and her family along with the potential risks of bleeding, infection, and perforation. She understands and is agreeable to proceed. If we can collect stool for enteric pathogens I would recommend this.  Component Value Date/Time   WBC 10.5 04/02/2012 0625   HGB 10.5* 04/02/2012 0625   HCT 30.1* 04/02/2012 0625   PLT 273 04/02/2012 0625   ALT 12 04/01/2012 2244   AST 30 04/01/2012 2244   NA 138 04/02/2012 0625   K 3.8 04/02/2012 0625   CL 106 04/02/2012 0625   CREATININE 0.59 04/02/2012 0625   BUN 9 04/02/2012 0625   CO2 27 04/02/2012 0625   CALCIUM 8.5 04/02/2012 0625   ALKPHOS 52 04/01/2012 2244

## 2012-04-02 NOTE — ED Provider Notes (Signed)
History     CSN: [id number]  Arrival date & time 04/01/12  2148   First MD Initiated Contact with Patient 04/01/12 2303      Chief Complaint  Patient presents with  . Abdominal Pain  . Rectal Bleeding    (Consider location/radiation/quality/duration/timing/severity/associated sxs/prior treatment) HPI 51 year old female presents to the emergency room with complaint of painless rectal bleeding. She reports last week she felt that she may have had some bad food, had a few days of churning in her abdomen, but denies nausea vomiting or diarrhea or true pain. Patient had mild cramping as if she was going to have loose stools today, and has had several episodes of bright red blood mixed with clots. She denies any abdominal pain. She denies any past medical history. She reports taking 3 ibuprofen last week when she was having stomach upset, but none since. She is on no blood thinners.  Past Medical History  Diagnosis Date  . S/P endometrial ablation 10/07  . Ovarian mass 06/2007    RIGHT OVARIAN ECHOGENIC MASS    Past Surgical History  Procedure Date  . Hysteroscopy 04/18/2006    HYST, D&C WITH NOVASURE  . Endometrial ablation 04/18/2006  . Pilonidal cyst drainage     X4  . Hip surgery 08/2004    LEFT HIP REPLACEMENT    Family History  Problem Relation Age of Onset  . Hypertension Mother   . Lung cancer Mother   . Heart disease Father   . Diabetes Maternal Grandmother   . Heart disease Maternal Grandfather     History  Substance Use Topics  . Smoking status: Never Smoker   . Smokeless tobacco: Never Used  . Alcohol Use: 3.0 oz/week    6 drink(s) per week    OB History    Grav Para Term Preterm Abortions TAB SAB Ect Mult Living   2 2 2       2       Review of Systems  All other systems reviewed and are negative.    Allergies  Review of patient's allergies indicates no known allergies.  Home Medications   Current Outpatient Rx  Name Route Sig Dispense  Refill  . IBUPROFEN 200 MG PO TABS Oral Take 600 mg by mouth every 6 (six) hours as needed.      BP 137/88  Pulse 60  Temp 98.6 F (37 C) (Oral)  Resp 18  SpO2 99%  LMP 03/30/2012  Physical Exam  Nursing note and vitals reviewed. Constitutional: She is oriented to person, place, and time. She appears well-developed and well-nourished.  HENT:  Head: Normocephalic and atraumatic.  Nose: Nose normal.  Mouth/Throat: Oropharynx is clear and moist.  Eyes: Conjunctivae normal and EOM are normal. Pupils are equal, round, and reactive to light.  Neck: Normal range of motion. Neck supple. No JVD present. No tracheal deviation present. No thyromegaly present.  Cardiovascular: Normal rate, regular rhythm, normal heart sounds and intact distal pulses.  Exam reveals no gallop and no friction rub.   No murmur heard. Pulmonary/Chest: Effort normal and breath sounds normal. No stridor. No respiratory distress. She has no wheezes. She has no rales. She exhibits no tenderness.  Abdominal: Soft. Bowel sounds are normal. She exhibits no distension and no mass. There is no tenderness. There is no rebound and no guarding.  Genitourinary: Guaiac positive stool.  Musculoskeletal: Normal range of motion. She exhibits no edema and no tenderness.  Lymphadenopathy:    She has no cervical adenopathy.  Neurological: She is alert and oriented to person, place, and time. No cranial nerve deficit. She exhibits normal muscle tone. Coordination normal.  Skin: Skin is warm and dry. No rash noted. No erythema. No pallor.  Psychiatric: She has a normal mood and affect. Her behavior is normal. Judgment and thought content normal.    ED Course  Procedures (including critical care time)  Labs Reviewed  CBC - Abnormal; Notable for the following:    RBC 3.55 (*)     Hemoglobin 11.5 (*)     HCT 32.8 (*)     All other components within normal limits  COMPREHENSIVE METABOLIC PANEL - Abnormal; Notable for the following:     Potassium 3.4 (*)     All other components within normal limits  HEMOGLOBIN - Abnormal; Notable for the following:    Hemoglobin 10.7 (*)     All other components within normal limits  OCCULT BLOOD, POC DEVICE  HEMOGLOBIN  HEMOGLOBIN  CBC  BASIC METABOLIC PANEL   No results found.   1. Acute GI bleeding   2. GI bleed       MDM  51 year old female with acute onset of painless rectal bleeding. Initial plan was 8 H&H was stable and no further bleeding, she would go home and followup outpatient with GI. Patient has had several more episodes of grossly bloody stools mixed with clots. She is still asymptomatic, but I am concern that she will continue to bleed if discharged home, will discuss with gastroenterology and plan for admission to hospitalist service        Olivia Mackie, MD 04/02/12 [phone]

## 2012-04-02 NOTE — Progress Notes (Signed)
   CARE MANAGEMENT NOTE 04/02/2012  Patient:  Mary Hughes, Mary Hughes   Account Number:  [id number]  Date Initiated:  04/02/2012  Documentation initiated by:  Jiles Crocker  Subjective/Objective Assessment:   ADMITTED WITH GIB     Action/Plan:   PCP: Thora Lance, MD  INDEPENDENT PRIOR TO ADMISSION; LIVES AT HOME WITH SPOUSE   Anticipated DC Date:  04/03/2012   Anticipated DC Plan:  HOME/SELF CARE      DC Planning Services  CM consult               Status of service:  In process, will continue to follow Medicare Important Message given?  NA - LOS <3 / Initial given by admissions (If response is "NO", the following Medicare IM given date fields will be blank)  Per UR Regulation:  Reviewed for med. necessity/level of care/duration of stay Comments:  04/02/2012- B Angle Dirusso RN, BSN, MHA

## 2012-04-02 NOTE — ED Notes (Signed)
Pt c/o dizziness after just using the BR--- MD notified of vital signs, order received to give NS 1000 ml bolus and to add cardiac monitoring (to be on telemetry) to admission orders.

## 2012-04-03 ENCOUNTER — Encounter (HOSPITAL_COMMUNITY)
Admission: EM | Disposition: A | Discharge status: Home / Self Care | Payer: Self-pay | Source: Home / Self Care | Attending: Emergency Medicine

## 2012-04-03 DIAGNOSIS — D649 Anemia, unspecified: Secondary | ICD-10-CM

## 2012-04-03 HISTORY — PX: COLONOSCOPY: SHX5424

## 2012-04-03 LAB — CBC
HCT: 27.7 % — ABNORMAL LOW (ref 36.0–46.0)
Hemoglobin: 9.6 g/dL — ABNORMAL LOW (ref 12.0–15.0)
MCV: 93 fL (ref 78.0–100.0)
RDW: 12.4 % (ref 11.5–15.5)
WBC: 4.9 10*3/uL (ref 4.0–10.5)

## 2012-04-03 SURGERY — COLONOSCOPY
Anesthesia: Moderate Sedation

## 2012-04-03 MED ORDER — MIDAZOLAM HCL 10 MG/2ML IJ SOLN
INTRAMUSCULAR | Status: DC | PRN
  Administered 2012-04-03 (×2): 1 mg via INTRAVENOUS
  Administered 2012-04-03: 2 mg via INTRAVENOUS
  Administered 2012-04-03 (×2): 1 mg via INTRAVENOUS
  Administered 2012-04-03: 2 mg via INTRAVENOUS

## 2012-04-03 MED ORDER — FENTANYL CITRATE 0.05 MG/ML IJ SOLN
INTRAMUSCULAR | Status: DC | PRN
  Administered 2012-04-03 (×2): 12.5 ug via INTRAVENOUS
  Administered 2012-04-03 (×3): 25 ug via INTRAVENOUS

## 2012-04-03 NOTE — Discharge Summary (Addendum)
Mary Hughes MRN: [id number] DOB/AGE: Jun 16, 1961 51 y.o.  Admit date: 04/01/2012 Discharge date: 04/03/2012  Primary Care Physician:  Mary Lance, MD   Discharge Diagnoses:   Patient Active Problem List  Diagnosis  . Ovarian mass  . GI bleed    DISCHARGE MEDICATION:   Medication List     As of 04/03/2012  3:12 PM    STOP taking these medications         ibuprofen 200 MG tablet   Commonly known as: ADVIL,MOTRIN          Consults:  Dr. Evette Hughes- GI   SIGNIFICANT DIAGNOSTIC STUDIES:  No results found.       OTHER PROCEDURES/COLONOSCOPY: The cecum and descending colon were normal. The transverse colon was  normal. In the proximal descending colon there was a small 3 mm  polyp that was biopsied off with cold forceps. In the sigmoid colon  there were scattered medium-sized diverticula. Also in the sigmoid  colon there was a focal area of submucosal reddish mucosa which may  have been a site of bleeding, however there is no evidence of  bleeding now. Retroflexion was normal in the rectum.    BRIEF ADMITTING H & P: This is is a 51 y.o. female who presents with c/o BRBPR onset earlier this afternoon. She describes it as passing clots and bright red blood with her stool but not melena. She notes this occurs in the context of some GI upset last week but no bloody stools at that time nor n/v/d.  Because of her BRBPR the patient presented to the ED, while her h/h was stable, she continued to pass blood in the ED and the ED has requested that hospitalist service admit the patient.    Hospital Course:  Present on Admission:  .GI bleed: The patient presented with findings consistent with lower  GI Bleed. She was given bowel rest and supportive care and  was seen in consultation by GI-Dr. Evette Hughes who recommended colonoscopic evaluation. Colonoscope findings were as above. There was no further bleeding and  recommendations were made to advance the diet and discharge to  home. Biopsy of polyp was taken and Dr. Luan Hughes Hughes will follow up with patient to review results of biopsy and proceed with any necessary treatment.  .Anemia: Pt had a stable hemoglobin upon arrival. Pt had a mild decrease in Hb which is felt to be mostly hemodilutional. At time of D/C pt had no further bleeding and Hb was stable.  Disposition and Follow-up:  D/C to home.     Discharge Orders    Future Orders Please Complete By Expires   Diet general      Activity as tolerated - No restrictions         DISCHARGE EXAM:  General: Alert, awake, oriented x3, in no acute distress.  Vital Signs: Blood pressure 123/76, pulse 82, temperature 97.5 F (36.4 C), temperature source Oral, resp. rate 18, height 5' 6.5" (1.689 m), weight 65.8 kg (145 lb 1 oz), last menstrual period 03/30/2012, SpO2 100.00%. OROPHARYNX: Moist, No exudate/ erythema/lesions.  Heart: Regular rate and rhythm, without murmurs, rubs, gallops, PMI non-displaced, no heaves or thrills on palpation.  Lungs: Clear to auscultation, no wheezing or rhonchi noted.  Abdomen: Soft, nontender, nondistended, positive bowel sounds, no masses no hepatosplenomegaly noted.[name] 04/02/12 0625 04/01/12 2244  NA 138 136  K 3.8 3.4*  CL 106 101  CO2 27 24  GLUCOSE 102* 98  BUN  9 12  CREATININE 0.59 0.65  CALCIUM 8.5 9.2  MG -- --  PHOS -- --    Basename 04/01/12 2244  AST 30  ALT 12  ALKPHOS 52  BILITOT 0.4  PROT 7.1  ALBUMIN 3.7   No results found for this basename: LIPASE:2,AMYLASE:2 in the last 72 hours  Basename 04/03/12 0454 04/02/12 1649 04/02/12 0625  WBC 4.9 -- 10.5  NEUTROABS -- -- --  HGB 9.6* 10.2* --  HCT 27.7* 29.2* --  MCV 93.0 -- 92.9  PLT 251 -- 273   Total time for discharge process including decision making and face to face time greater than 30 minutes. Signed: [name] 04/03/2012, 3:12 PM

## 2012-04-03 NOTE — Op Note (Signed)
[facility] [address] [place], [address]   COLONOSCOPY PROCEDURE REPORT  PATIENT: Mary, Hughes.  MR#: [id number] BIRTHDATE: [birth year] , 51  yrs. old GENDER: Female ENDOSCOPIST: Wandalee Ferdinand, MD REFERRED BY: PROCEDURE DATE:  04/03/2012 PROCEDURE:   colonoscopy with biopsy ASA CLASS:   2 INDICATIONS:rectal bleeding MEDICATIONS: fentanyl 100 mcg IV, Versed 8 mg IV    DESCRIPTION OF PROCEDURE:   After the risks benefits and alternatives of the procedure were thoroughly explained, informed consent was obtained.  a digital rectal exam was normal.        The Pentax Ped Colon [id number]  endoscope was introduced through the anus and advanced to the cecum     . No adverse events experienced. The quality of the prep was good       The instrument was then slowly withdrawn as the colon was fully examined.  Findings:  The cecum and descending colon were normal. The transverse colon was normal. In the proximal descending colon there was a small 3 mm polyp that was biopsied off with cold forceps. In the sigmoid colon there were scattered medium-sized diverticula. Also in the sigmoid colon there was a focal area of submucosal reddish mucosa which may have been a site of bleeding, however there is no evidence of bleeding now. Retroflexion was normal in the rectum.    The time to cecum=  .  Withdrawal time=  .  The scope was withdrawn and the procedure completed. COMPLICATIONS: There were no complications.  ENDOSCOPIC IMPRESSION:see above  RECOMMENDATIONS: advance diet. She may be discharged today.  eSigned:  Wandalee Ferdinand, MD 04/03/2012 11:51 AM   cc:

## 2012-04-04 ENCOUNTER — Encounter (HOSPITAL_COMMUNITY): Payer: Self-pay

## 2012-04-04 ENCOUNTER — Encounter (HOSPITAL_COMMUNITY): Payer: Self-pay | Admitting: Gastroenterology

## 2012-04-04 MED FILL — Diphenhydramine HCl Inj 50 MG/ML: INTRAMUSCULAR | Qty: 1 | Status: AC

## 2012-04-15 ENCOUNTER — Other Ambulatory Visit: Payer: Self-pay | Admitting: Gynecology

## 2012-04-15 DIAGNOSIS — Z1231 Encounter for screening mammogram for malignant neoplasm of breast: Secondary | ICD-10-CM

## 2012-05-09 ENCOUNTER — Ambulatory Visit
Admission: RE | Admit: 2012-05-09 | Discharge: 2012-05-09 | Disposition: A | Discharge status: Home / Self Care | Payer: BC Managed Care – PPO | Source: Ambulatory Visit | Attending: Gynecology | Admitting: Gynecology

## 2012-05-09 DIAGNOSIS — Z1231 Encounter for screening mammogram for malignant neoplasm of breast: Secondary | ICD-10-CM

## 2013-01-08 ENCOUNTER — Ambulatory Visit (INDEPENDENT_AMBULATORY_CARE_PROVIDER_SITE_OTHER): Payer: BC Managed Care – PPO | Admitting: Gynecology

## 2013-01-08 ENCOUNTER — Encounter: Payer: Self-pay | Admitting: Gynecology

## 2013-01-08 VITALS — BP 120/70 | Ht 66.5 in | Wt 142.0 lb

## 2013-01-08 DIAGNOSIS — N83209 Unspecified ovarian cyst, unspecified side: Secondary | ICD-10-CM

## 2013-01-08 DIAGNOSIS — Z01419 Encounter for gynecological examination (general) (routine) without abnormal findings: Secondary | ICD-10-CM

## 2013-01-08 DIAGNOSIS — Z13220 Encounter for screening for lipoid disorders: Secondary | ICD-10-CM

## 2013-01-08 LAB — CBC WITH DIFFERENTIAL/PLATELET
Basophils Relative: 0 % (ref 0–1)
Eosinophils Absolute: 0.1 10*3/uL (ref 0.0–0.7)
Eosinophils Relative: 1 % (ref 0–5)
Hemoglobin: 12.4 g/dL (ref 12.0–15.0)
Lymphs Abs: 2.1 10*3/uL (ref 0.7–4.0)
MCH: 33.7 pg (ref 26.0–34.0)
MCHC: 35.5 g/dL (ref 30.0–36.0)
MCV: 94.8 fL (ref 78.0–100.0)
Monocytes Absolute: 0.7 10*3/uL (ref 0.1–1.0)
Monocytes Relative: 10 % (ref 3–12)
Neutrophils Relative %: 61 % (ref 43–77)
RBC: 3.68 MIL/uL — ABNORMAL LOW (ref 3.87–5.11)

## 2013-01-08 NOTE — Progress Notes (Signed)
Mary Hughes 06-06-1961 [id number]        52 y.o.  [id number] for annual exam.  Doing well without complaints.  Past medical history,surgical history, medications, allergies, family history and social history were all reviewed and documented in the EPIC chart.  ROS:  Performed and pertinent positives and negatives are included in the history, assessment and plan .  Exam: Kim assistant Filed Vitals:   01/08/13 1530  BP: 120/70  Height: 5' 6.5" (1.689 m)  Weight: 142 lb (64.411 kg)   General appearance  Normal Skin grossly normal Head/Neck normal with no cervical or supraclavicular adenopathy thyroid normal Lungs  clear Cardiac RR, without RMG Abdominal  soft, nontender, without masses, organomegaly or hernia Breasts  examined lying and sitting without masses, retractions, discharge or axillary adenopathy. Pelvic  Ext/BUS/vagina  normal   Cervix  normal   Uterus  anteverted, normal size, shape and contour, midline and mobile nontender   Adnexa  Without masses or tenderness    Anus and perineum  normal   Rectovaginal  normal sphincter tone without palpated masses or tenderness.    Assessment/Plan:  52 y.o. G97P2002 female for annual exam, vasectomy birth control.   1. History endometrial ablation 2007. Scant to absent menses monthly with premenstrual symptoms. No intermenstrual bleeding or other issues. No hot flushes night sweats. Continue to monitor. 2. History of right echogenic ovarian mass. Followed and stable with serial ultrasounds. Last ultrasound 06/2011 with 18 mm mean. Recommend followup ultrasound now. Assuming stable then we'll stop following as it has been present since 2007. 3. Pap smear 2012. The Pap smear done today. No history of significant abnormal Pap smears. Repeat Pap smear next year a 3 year interval. 4. Mammography 04/2012. Repeat mammography this coming fall. SBE monthly reviewed. 5. Health maintenance. Baseline CBC lipid profile comprehensive metabolic  panel TSH vitamin D urinalysis ordered. Followup for ultrasound, otherwise annually.  Note: This document was prepared with digital dictation and possible smart phrase technology. Any transcriptional errors that result from this process are unintentional.   Dara Lords MD, 4:07 PM 01/08/2013

## 2013-01-08 NOTE — Patient Instructions (Signed)
Followup for ultrasound. Otherwise followup in one year for annual exam.

## 2013-01-09 ENCOUNTER — Other Ambulatory Visit: Payer: Self-pay | Admitting: Women's Health

## 2013-01-09 DIAGNOSIS — R7989 Other specified abnormal findings of blood chemistry: Secondary | ICD-10-CM

## 2013-01-09 LAB — LIPID PANEL
Cholesterol: 162 mg/dL (ref 0–200)
HDL: 72 mg/dL (ref >39)
LDL Cholesterol: 70 mg/dL (ref 0–99)
Total CHOL/HDL Ratio: 2.3 Ratio
Triglycerides: 98 mg/dL (ref <150)
VLDL: 20 mg/dL (ref 0–40)

## 2013-01-09 LAB — COMPREHENSIVE METABOLIC PANEL
Alkaline Phosphatase: 53 U/L (ref 39–117)
BUN: 16 mg/dL (ref 6–23)
CO2: 24 mEq/L (ref 19–32)
Creat: 0.65 mg/dL (ref 0.50–1.10)
Glucose, Bld: 89 mg/dL (ref 70–99)
Total Bilirubin: 0.7 mg/dL (ref 0.3–1.2)
Total Protein: 7.3 g/dL (ref 6.0–8.3)

## 2013-01-09 LAB — URINALYSIS W MICROSCOPIC + REFLEX CULTURE
Bacteria, UA: NONE SEEN
Bilirubin Urine: NEGATIVE
Crystals: NONE SEEN
Hgb urine dipstick: NEGATIVE
Ketones, ur: NEGATIVE mg/dL
Specific Gravity, Urine: 1.028 (ref 1.005–1.030)
Urobilinogen, UA: 0.2 mg/dL (ref 0.0–1.0)

## 2013-01-09 LAB — TSH: TSH: 2.392 u[IU]/mL (ref 0.350–4.500)

## 2013-01-10 ENCOUNTER — Other Ambulatory Visit: Payer: BC Managed Care – PPO

## 2013-01-14 ENCOUNTER — Other Ambulatory Visit: Payer: BC Managed Care – PPO

## 2013-01-14 DIAGNOSIS — R7989 Other specified abnormal findings of blood chemistry: Secondary | ICD-10-CM

## 2013-01-14 LAB — HEPATIC FUNCTION PANEL
ALT: 16 U/L (ref 0–35)
AST: 28 U/L (ref 0–37)
Albumin: 4.4 g/dL (ref 3.5–5.2)
Total Bilirubin: 1.3 mg/dL — ABNORMAL HIGH (ref 0.3–1.2)

## 2013-04-15 ENCOUNTER — Other Ambulatory Visit: Payer: Self-pay

## 2013-04-15 DIAGNOSIS — Z1231 Encounter for screening mammogram for malignant neoplasm of breast: Secondary | ICD-10-CM

## 2013-05-13 ENCOUNTER — Ambulatory Visit
Admission: RE | Admit: 2013-05-13 | Discharge: 2013-05-13 | Disposition: A | Discharge status: Home / Self Care | Payer: BC Managed Care – PPO | Source: Ambulatory Visit

## 2013-05-13 DIAGNOSIS — Z1231 Encounter for screening mammogram for malignant neoplasm of breast: Secondary | ICD-10-CM

## 2013-12-01 ENCOUNTER — Other Ambulatory Visit: Payer: Self-pay | Admitting: Dermatology

## 2014-01-09 ENCOUNTER — Other Ambulatory Visit: Payer: BC Managed Care – PPO

## 2014-01-09 ENCOUNTER — Other Ambulatory Visit (HOSPITAL_COMMUNITY)
Admission: RE | Admit: 2014-01-09 | Discharge: 2014-01-09 | Disposition: A | Discharge status: Home / Self Care | Payer: BC Managed Care – PPO | Source: Ambulatory Visit | Attending: Gynecology | Admitting: Gynecology

## 2014-01-09 ENCOUNTER — Other Ambulatory Visit: Payer: Self-pay | Admitting: Gynecology

## 2014-01-09 ENCOUNTER — Ambulatory Visit (INDEPENDENT_AMBULATORY_CARE_PROVIDER_SITE_OTHER): Payer: BC Managed Care – PPO | Admitting: Gynecology

## 2014-01-09 ENCOUNTER — Ambulatory Visit: Payer: BC Managed Care – PPO | Admitting: Gynecology

## 2014-01-09 ENCOUNTER — Encounter: Payer: BC Managed Care – PPO | Admitting: Gynecology

## 2014-01-09 ENCOUNTER — Ambulatory Visit (INDEPENDENT_AMBULATORY_CARE_PROVIDER_SITE_OTHER): Payer: BC Managed Care – PPO

## 2014-01-09 ENCOUNTER — Encounter: Payer: Self-pay | Admitting: Gynecology

## 2014-01-09 VITALS — BP 112/60 | Ht 66.5 in | Wt 141.0 lb

## 2014-01-09 DIAGNOSIS — N839 Noninflammatory disorder of ovary, fallopian tube and broad ligament, unspecified: Secondary | ICD-10-CM

## 2014-01-09 DIAGNOSIS — N838 Other noninflammatory disorders of ovary, fallopian tube and broad ligament: Secondary | ICD-10-CM

## 2014-01-09 DIAGNOSIS — R1903 Right lower quadrant abdominal swelling, mass and lump: Secondary | ICD-10-CM

## 2014-01-09 DIAGNOSIS — R42 Dizziness and giddiness: Secondary | ICD-10-CM

## 2014-01-09 DIAGNOSIS — Z1151 Encounter for screening for human papillomavirus (HPV): Secondary | ICD-10-CM | POA: Insufficient documentation

## 2014-01-09 DIAGNOSIS — Z01419 Encounter for gynecological examination (general) (routine) without abnormal findings: Secondary | ICD-10-CM

## 2014-01-09 LAB — COMPREHENSIVE METABOLIC PANEL
ALT: 14 U/L (ref 0–35)
AST: 27 U/L (ref 0–37)
Albumin: 4.4 g/dL (ref 3.5–5.2)
Alkaline Phosphatase: 50 U/L (ref 39–117)
BUN: 13 mg/dL (ref 6–23)
CALCIUM: 9.2 mg/dL (ref 8.4–10.5)
CO2: 27 meq/L (ref 19–32)
Chloride: 101 mEq/L (ref 96–112)
Creat: 0.69 mg/dL (ref 0.50–1.10)
Glucose, Bld: 87 mg/dL (ref 70–99)
POTASSIUM: 3.9 meq/L (ref 3.5–5.3)
SODIUM: 136 meq/L (ref 135–145)
TOTAL PROTEIN: 7.7 g/dL (ref 6.0–8.3)
Total Bilirubin: 1.5 mg/dL — ABNORMAL HIGH (ref 0.2–1.2)

## 2014-01-09 LAB — TSH: TSH: 3.873 u[IU]/mL (ref 0.350–4.500)

## 2014-01-09 LAB — CBC WITH DIFFERENTIAL/PLATELET
Basophils Absolute: 0 10*3/uL (ref 0.0–0.1)
Basophils Relative: 0 % (ref 0–1)
Eosinophils Absolute: 0.1 10*3/uL (ref 0.0–0.7)
Eosinophils Relative: 1 % (ref 0–5)
HCT: 38.1 % (ref 36.0–46.0)
Hemoglobin: 13.2 g/dL (ref 12.0–15.0)
LYMPHS ABS: 1.7 10*3/uL (ref 0.7–4.0)
Lymphocytes Relative: 25 % (ref 12–46)
MCH: 32.9 pg (ref 26.0–34.0)
MCHC: 34.6 g/dL (ref 30.0–36.0)
MCV: 95 fL (ref 78.0–100.0)
Monocytes Absolute: 0.5 10*3/uL (ref 0.1–1.0)
Monocytes Relative: 7 % (ref 3–12)
NEUTROS ABS: 4.5 10*3/uL (ref 1.7–7.7)
Neutrophils Relative %: 67 % (ref 43–77)
Platelets: 253 10*3/uL (ref 150–400)
RBC: 4.01 MIL/uL (ref 3.87–5.11)
RDW: 14.2 % (ref 11.5–15.5)
WBC: 6.7 10*3/uL (ref 4.0–10.5)

## 2014-01-09 LAB — LIPID PANEL
CHOLESTEROL: 160 mg/dL (ref 0–200)
HDL: 78 mg/dL (ref >39)
LDL Cholesterol: 73 mg/dL (ref 0–99)
Total CHOL/HDL Ratio: 2.1 Ratio
Triglycerides: 47 mg/dL (ref <150)
VLDL: 9 mg/dL (ref 0–40)

## 2014-01-09 NOTE — Progress Notes (Addendum)
Mary Hughes 1961-05-03 [id number]        53 y.o.  G2P2002 for annual exam.  Several issues noted below.  Past medical history,surgical history, problem list, medications, allergies, family history and social history were all reviewed and documented as reviewed in the EPIC chart.  ROS:  12 system ROS performed with pertinent positives and negatives included in the history, assessment and plan.   Additional significant findings :  None   Exam: [name] assistant Filed Vitals:   01/09/14 0928  BP: 112/60  Height: 5' 6.5" (1.689 m)  Weight: 141 lb (63.957 kg)   General appearance:  Normal affect, orientation and appearance. Skin: Grossly normal HEENT: Without gross lesions.  No cervical or supraclavicular adenopathy. Thyroid normal.  Lungs:  Clear without wheezing, rales or rhonchi Cardiac: RR, without RMG Abdominal:  Soft, nontender, without masses, guarding, rebound, organomegaly or hernia Breasts:  Examined lying and sitting without masses, retractions, discharge or axillary adenopathy. Pelvic:  Ext/BUS/vagina normal  Cervix normal. Pap/HPV  Uterus anteverted, normal size, shape and contour, midline and mobile nontender   Adnexa  Without masses or tenderness    Anus and perineum  Normal   Rectovaginal  Normal sphincter tone without palpated masses or tenderness.    Assessment/Plan:  53 y.o. G67P2002 female for annual exam without menses, vasectomy birth control.   1. Amenorrhea. Patient status post endometrial ablation without menses. Does have moliminal symptoms.  Occasional spotting with this. No hot flushes or night sweats. Will check [facility] for baseline. Otherwise monitor. Report any atypical bleeding. 2. Dizzy spells. Over the past year patient says she just doesn't feel well. She is describing at the end of the afternoon usually feeling fatigued and somewhat lightheaded. No neurologic symptoms/chest pain/orthostatic changes or other issues. No weight changes hair or skin  changes. Will check baseline labs to include CBC comprehensive metabolic panel TSH. Suspect hypoglycemic episodes. Recommend more frequent smaller meals to see if this doesn't resolve her symptoms. Will followup with primary M.D. if continues. 3. Echogenic right ovarian mass. Patient had repeat ultrasound today in followup of her persistent echogenic right ovarian mass. It has been present for years unchanged. Ultrasound shows bilateral ovarian physiologic changes and persistence of the avascular echogenic right ovarian mass approximately 18 mm mean unchanged from prior studies. Will continue to follow expectantly as she is asymptomatic and this has remained unchanged for years. 4. Pap smear 2012. Pap/HPV today. No history of significant abnormal Pap smears. Repeat at 3-5 year interval assuming this Pap smear is normal per current screening guidelines. 5. Mammography 04/2013. Will schedule mammography this coming fall. SBE monthly reviewed. 6. DEXA never. We'll plan further into the menopause. Increase calcium vitamin D reviewed. Check vitamin D level today. 7. Colonoscopy 2013. Repeat at their recommended interval. 8. Health maintenance. Baseline CBC comprehensive metabolic panel lipid profile urinalysis TSH vitamin D FSH ordered. Followup for lab results otherwise followup annually.   Note: This document was prepared with digital dictation and possible smart phrase technology. Any transcriptional errors that result from this process are unintentional.   Anastasio Auerbach MD, 10:00 AM 01/09/2014

## 2014-01-09 NOTE — Addendum Note (Signed)
Addended by: Alen Blew on: 01/09/2014 11:14 AM   Modules accepted: Orders

## 2014-01-09 NOTE — Patient Instructions (Addendum)
Followup for lab work otherwise followup in one year for annual exam, sooner if any issues.  You may obtain a copy of any labs that were done today by logging onto MyChart as outlined in the instructions provided with your AVS (after visit summary). The office will not call with normal lab results but certainly if there are any significant abnormalities then we will contact you.   Health Maintenance, Female A healthy lifestyle and preventative care can promote health and wellness.  Maintain regular health, dental, and eye exams.  Eat a healthy diet. Foods like vegetables, fruits, whole grains, low-fat dairy products, and lean protein foods contain the nutrients you need without too many calories. Decrease your intake of foods high in solid fats, added sugars, and salt. Get information about a proper diet from your caregiver, if necessary.  Regular physical exercise is one of the most important things you can do for your health. Most adults should get at least 150 minutes of moderate-intensity exercise (any activity that increases your heart rate and causes you to sweat) each week. In addition, most adults need muscle-strengthening exercises on 2 or more days a week.   Maintain a healthy weight. The body mass index (BMI) is a screening tool to identify possible weight problems. It provides an estimate of body fat based on height and weight. Your caregiver can help determine your BMI, and can help you achieve or maintain a healthy weight. For adults 20 years and older:  A BMI below 18.5 is considered underweight.  A BMI of 18.5 to 24.9 is normal.  A BMI of 25 to 29.9 is considered overweight.  A BMI of 30 and above is considered obese.  Maintain normal blood lipids and cholesterol by exercising and minimizing your intake of saturated fat. Eat a balanced diet with plenty of fruits and vegetables. Blood tests for lipids and cholesterol should begin at age 43 and be repeated every 5 years. If your  lipid or cholesterol levels are high, you are over 50, or you are a high risk for heart disease, you may need your cholesterol levels checked more frequently.Ongoing high lipid and cholesterol levels should be treated with medicines if diet and exercise are not effective.  If you smoke, find out from your caregiver how to quit. If you do not use tobacco, do not start.  Lung cancer screening is recommended for adults aged 52 80 years who are at high risk for developing lung cancer because of a history of smoking. Yearly low-dose computed tomography (CT) is recommended for people who have at least a 30-pack-year history of smoking and are a current smoker or have quit within the past 15 years. A pack year of smoking is smoking an average of 1 pack of cigarettes a day for 1 year (for example: 1 pack a day for 30 years or 2 packs a day for 15 years). Yearly screening should continue until the smoker has stopped smoking for at least 15 years. Yearly screening should also be stopped for people who develop a health problem that would prevent them from having lung cancer treatment.  If you are pregnant, do not drink alcohol. If you are breastfeeding, be very cautious about drinking alcohol. If you are not pregnant and choose to drink alcohol, do not exceed 1 drink per day. One drink is considered to be 12 ounces (355 mL) of beer, 5 ounces (148 mL) of wine, or 1.5 ounces (44 mL) of liquor.  Avoid use of street drugs.  Do not share needles with anyone. Ask for help if you need support or instructions about stopping the use of drugs.  High blood pressure causes heart disease and increases the risk of stroke. Blood pressure should be checked at least every 1 to 2 years. Ongoing high blood pressure should be treated with medicines, if weight loss and exercise are not effective.  If you are 91 to 51 years old, ask your caregiver if you should take aspirin to prevent strokes.  Diabetes screening involves taking a  blood sample to check your fasting blood sugar level. This should be done once every 3 years, after age 33, if you are within normal weight and without risk factors for diabetes. Testing should be considered at a younger age or be carried out more frequently if you are overweight and have at least 1 risk factor for diabetes.  Breast cancer screening is essential preventative care for women. You should practice "breast self-awareness." This means understanding the normal appearance and feel of your breasts and may include breast self-examination. Any changes detected, no matter how small, should be reported to a caregiver. Women in their 66s and 30s should have a clinical breast exam (CBE) by a caregiver as part of a regular health exam every 1 to 3 years. After age 50, women should have a CBE every year. Starting at age 31, women should consider having a mammogram (breast X-ray) every year. Women who have a family history of breast cancer should talk to their caregiver about genetic screening. Women at a high risk of breast cancer should talk to their caregiver about having an MRI and a mammogram every year.  Breast cancer gene (BRCA)-related cancer risk assessment is recommended for women who have family members with BRCA-related cancers. BRCA-related cancers include breast, ovarian, tubal, and peritoneal cancers. Having family members with these cancers may be associated with an increased risk for harmful changes (mutations) in the breast cancer genes BRCA1 and BRCA2. Results of the assessment will determine the need for genetic counseling and BRCA1 and BRCA2 testing.  The Pap test is a screening test for cervical cancer. Women should have a Pap test starting at age 58. Between ages 90 and 13, Pap tests should be repeated every 2 years. Beginning at age 17, you should have a Pap test every 3 years as long as the past 3 Pap tests have been normal. If you had a hysterectomy for a problem that was not cancer or  a condition that could lead to cancer, then you no longer need Pap tests. If you are between ages 16 and 9, and you have had normal Pap tests going back 10 years, you no longer need Pap tests. If you have had past treatment for cervical cancer or a condition that could lead to cancer, you need Pap tests and screening for cancer for at least 20 years after your treatment. If Pap tests have been discontinued, risk factors (such as a new sexual partner) need to be reassessed to determine if screening should be resumed. Some women have medical problems that increase the chance of getting cervical cancer. In these cases, your caregiver may recommend more frequent screening and Pap tests.  The human papillomavirus (HPV) test is an additional test that may be used for cervical cancer screening. The HPV test looks for the virus that can cause the cell changes on the cervix. The cells collected during the Pap test can be tested for HPV. The HPV test could be used  to screen women aged 23 years and older, and should be used in women of any age who have unclear Pap test results. After the age of 69, women should have HPV testing at the same frequency as a Pap test.  Colorectal cancer can be detected and often prevented. Most routine colorectal cancer screening begins at the age of 8 and continues through age 40. However, your caregiver may recommend screening at an earlier age if you have risk factors for colon cancer. On a yearly basis, your caregiver may provide home test kits to check for hidden blood in the stool. Use of a small camera at the end of a tube, to directly examine the colon (sigmoidoscopy or colonoscopy), can detect the earliest forms of colorectal cancer. Talk to your caregiver about this at age 31, when routine screening begins. Direct examination of the colon should be repeated every 5 to 10 years through age 37, unless early forms of pre-cancerous polyps or small growths are found.  Hepatitis C  blood testing is recommended for all people born from [birth year] through 1963 and any individual with known risks for hepatitis C.  Practice safe sex. Use condoms and avoid high-risk sexual practices to reduce the spread of sexually transmitted infections (STIs). Sexually active women aged 55 and younger should be checked for Chlamydia, which is a common sexually transmitted infection. Older women with new or multiple partners should also be tested for Chlamydia. Testing for other STIs is recommended if you are sexually active and at increased risk.  Osteoporosis is a disease in which the bones lose minerals and strength with aging. This can result in serious bone fractures. The risk of osteoporosis can be identified using a bone density scan. Women ages 79 and over and women at risk for fractures or osteoporosis should discuss screening with their caregivers. Ask your caregiver whether you should be taking a calcium supplement or vitamin D to reduce the rate of osteoporosis.  Menopause can be associated with physical symptoms and risks. Hormone replacement therapy is available to decrease symptoms and risks. You should talk to your caregiver about whether hormone replacement therapy is right for you.  Use sunscreen. Apply sunscreen liberally and repeatedly throughout the day. You should seek shade when your shadow is shorter than you. Protect yourself by wearing long sleeves, pants, a wide-brimmed hat, and sunglasses year round, whenever you are outdoors.  Notify your caregiver of new moles or changes in moles, especially if there is a change in shape or color. Also notify your caregiver if a mole is larger than the size of a pencil eraser.  Stay current with your immunizations. Document Released: 12/19/2010 Document Revised: 09/30/2012 Document Reviewed: 12/19/2010 [facility] Patient Information 2014 [employer].

## 2014-01-10 LAB — URINALYSIS W MICROSCOPIC + REFLEX CULTURE
Bacteria, UA: NONE SEEN
Bilirubin Urine: NEGATIVE
CRYSTALS: NONE SEEN
Casts: NONE SEEN
GLUCOSE, UA: NEGATIVE mg/dL
Hgb urine dipstick: NEGATIVE
Ketones, ur: NEGATIVE mg/dL
Leukocytes, UA: NEGATIVE
Nitrite: NEGATIVE
Protein, ur: NEGATIVE mg/dL
SPECIFIC GRAVITY, URINE: 1.026 (ref 1.005–1.030)
UROBILINOGEN UA: 0.2 mg/dL (ref 0.0–1.0)
pH: 7 (ref 5.0–8.0)

## 2014-01-10 LAB — FOLLICLE STIMULATING HORMONE: FSH: 5.5 m[IU]/mL

## 2014-01-10 LAB — VITAMIN D 25 HYDROXY (VIT D DEFICIENCY, FRACTURES): Vit D, 25-Hydroxy: 52 ng/mL (ref 30–89)

## 2014-01-14 LAB — CYTOLOGY - PAP

## 2014-03-05 ENCOUNTER — Emergency Department (HOSPITAL_COMMUNITY)
Admission: EM | Admit: 2014-03-05 | Discharge: 2014-03-05 | Disposition: A | Discharge status: Home / Self Care | Payer: BC Managed Care – PPO | Source: Home / Self Care

## 2014-03-05 ENCOUNTER — Encounter (HOSPITAL_COMMUNITY): Payer: Self-pay | Admitting: Emergency Medicine

## 2014-03-05 ENCOUNTER — Emergency Department (INDEPENDENT_AMBULATORY_CARE_PROVIDER_SITE_OTHER): Payer: BC Managed Care – PPO

## 2014-03-05 DIAGNOSIS — M79609 Pain in unspecified limb: Secondary | ICD-10-CM

## 2014-03-05 DIAGNOSIS — M659 Synovitis and tenosynovitis, unspecified: Secondary | ICD-10-CM

## 2014-03-05 DIAGNOSIS — M79672 Pain in left foot: Secondary | ICD-10-CM

## 2014-03-05 DIAGNOSIS — M65979 Unspecified synovitis and tenosynovitis, unspecified ankle and foot: Secondary | ICD-10-CM

## 2014-03-05 MED ORDER — COLCHICINE 0.6 MG PO TABS: ORAL_TABLET | ORAL | Status: DC

## 2014-03-05 NOTE — ED Notes (Signed)
Reports having "some" pain in the ball of the foot (left), pain top of foot.  Went to [facility], had an xray and told to use metatarsal pad and usual activity.  Today patient went for a 3 mile walk and now is having pain and swelling.

## 2014-03-05 NOTE — ED Provider Notes (Signed)
CSN: [id number]     Arrival date & time 03/05/14  1718 History   None    Chief Complaint  Patient presents with  . Foot Pain   (Consider location/radiation/quality/duration/timing/severity/associated sxs/prior Treatment) HPI Comments: Patient presents with left foot pain. As stated by RN; she had an evaluation by Orthopedics 3-4 weeks ago, and xrays by report were normal. She was instructed on use of a metatarsal pad and has been using this. The foot was feeling better until this afternoon following her 3 mile walk. She describes pain, swelling to foot at the base of the 2nd and 3rd toe. No warmth is noted. No fever or chills.   Patient is a 53 y.o. female presenting with lower extremity pain. The history is provided by the patient.  Foot Pain    Past Medical History  Diagnosis Date  . S/P endometrial ablation 10/07  . Ovarian mass 06/2005    RIGHT OVARIAN ECHOGENIC MASS   Past Surgical History  Procedure Laterality Date  . Hysteroscopy  04/18/2006    HYST, D&C WITH NOVASURE  . Endometrial ablation  04/18/2006  . Pilonidal cyst drainage      X4  . Hip surgery  08/2004    LEFT HIP REPLACEMENT  . Joint replacement      left hip  . Colonoscopy  04/03/2012    Procedure: COLONOSCOPY;  Surgeon: Wonda Horner, MD;  Location: [facility];  Service: Endoscopy;  Laterality: N/A;   Family History  Problem Relation Age of Onset  . Hypertension Mother   . Lung cancer Mother   . Heart disease Father   . Diabetes Maternal Grandmother   . Heart disease Maternal Grandfather    History  Substance Use Topics  . Smoking status: Never Smoker   . Smokeless tobacco: Never Used  . Alcohol Use: 3.0 oz/week    6 drink(s) per week   OB History   Grav Para Term Preterm Abortions TAB SAB Ect Mult Living   2 2 2       2      Review of Systems  Respiratory: Negative.   Cardiovascular: Negative.   Musculoskeletal: Positive for arthralgias and joint swelling.  Neurological: Negative.   Negative for dizziness.  All other systems reviewed and are negative.   Allergies  Review of patient's allergies indicates no known allergies.  Home Medications   Prior to Admission medications   Medication Sig Start Date End Date Taking? Authorizing Provider  colchicine 0.6 MG tablet Take 1 tablet now then 1 tablet 1 hour later, then at 12 hours, then bid for 5 days then stop 03/05/14   Bjorn Pippin, PA-C  imiquimod (ALDARA) 5 % cream Apply topically 3 (three) times a week.    Historical Provider, MD  MINOCYCLINE HCL ER PO Take by mouth.    Historical Provider, MD   BP 180/106  Pulse 66  Temp(Src) 98.6 F (37 C) (Oral)  Resp 14  SpO2 99%  LMP 02/02/2014 Physical Exam  Nursing note and vitals reviewed. Constitutional: She is oriented to person, place, and time. She appears well-developed and well-nourished. No distress.  Pulmonary/Chest: Effort normal.  Musculoskeletal:  Left forefoot with synovitis, no warmth or erythema. Pain to palpation along the MTP heads and dorsum of the foot. Full ROM. Pain with flexion and extension of the 2nd and 3rd toe.   Neurological: She is alert and oriented to person, place, and time.  Skin: Skin is warm and dry. No rash noted. She is  not diaphoretic. No pallor.  Psychiatric: Her behavior is normal.    ED Course  Procedures (including critical care time) Labs Review Labs Reviewed - No data to display  Imaging Review Dg Foot Complete Left  03/05/2014   CLINICAL DATA:  Left foot pain and swelling. Inability to bear weight.  EXAM: LEFT FOOT - COMPLETE 3+ VIEW  COMPARISON:  None.  FINDINGS: There is no evidence of fracture or dislocation. There is no evidence of arthropathy.  Bony and soft tissue bunion formation noted. No other significant bone abnormality identified.  IMPRESSION: No acute findings.  Bony and soft tissue bunion.   Electronically Signed   By: Earle Gell M.D.   On: 03/05/2014 18:52     MDM   1. Synovitis of foot   2. Foot  pain, left    Xrays negative. Limited U/S of the foot show synovial hypertrophy of the 3rd MTP; without active synovitis. Non-compressible. Starry night appearance and hyperechoic material suggestive of MSU crystals which suggest gouty arthritis. The remainder of the tendons, and MT shaft normal. No evidence of a stress fracture. Discussed with Dr. Georgina Snell; trial of Colchicine. Avoid NSAIDs in setting of elevated BP today. Tylenol is appropriate. Will provide a post op shoe. Avoidance of weight bearing exercise while joint is inflamed. F/U with Dr. Andreas Blower. Should other joints begin to be an issue; may need a Rheumatology eval. Of note she was incidentally found to have asymptomatic elevated BP. We discussed this. F/U with PCP in 24-48 hours for a BP check. Should she begin to have headaches, sob, or other symptoms then please f/u for further eval.    Bjorn Pippin, PA-C 03/05/14 2059

## 2014-03-05 NOTE — Discharge Instructions (Signed)
° ° °  Synovitis of the let 3rd MTP-Suggest gouty material in joint. Treat with Colchicine as directed.  Wear post op shoe, until f/u with Dr. Oneida Alar. Nice to meet you No aleve or Advil in the setting of elevated BP-F/U with PCP for a recheck of BP

## 2014-03-06 ENCOUNTER — Other Ambulatory Visit: Payer: Self-pay | Admitting: Family Medicine

## 2014-03-06 ENCOUNTER — Encounter: Payer: Self-pay | Admitting: Podiatrist

## 2014-03-06 ENCOUNTER — Ambulatory Visit: Payer: BC Managed Care – PPO | Admitting: Podiatrist

## 2014-03-06 DIAGNOSIS — I10 Essential (primary) hypertension: Secondary | ICD-10-CM

## 2014-03-06 NOTE — ED Provider Notes (Signed)
I performed a limited musculoskeletal ultrasound. Prescription as noted in the noted above. Agree with plan  Medical screening examination/treatment/procedure(s) were performed by a resident physician or non-physician practitioner and as the supervising physician I was immediately available for consultation/collaboration.  Lynne Leader, MD     Gregor Hams, MD 03/06/14 [phone]

## 2014-03-12 ENCOUNTER — Ambulatory Visit (INDEPENDENT_AMBULATORY_CARE_PROVIDER_SITE_OTHER): Payer: BC Managed Care – PPO | Admitting: Sports Medicine

## 2014-03-12 ENCOUNTER — Encounter: Payer: Self-pay | Admitting: Sports Medicine

## 2014-03-12 VITALS — BP 140/88 | Ht 66.5 in | Wt 140.0 lb

## 2014-03-12 DIAGNOSIS — S93529A Sprain of metatarsophalangeal joint of unspecified toe(s), initial encounter: Secondary | ICD-10-CM | POA: Diagnosis not present

## 2014-03-12 NOTE — Assessment & Plan Note (Signed)
MPJ joint sprain and metatarsalgia:  Recommendations: -Patient's metatarsal pad was personally placed to allow for properly patient to relieve metatarsal pressure. -Patient was placed in a sports insoles with scaphoid pad for additional arch supported -Advised patient to discontinue aggressive walking for the next week to 10 days followed by returning to walking in the third week with a decrease in mileage to about 2 miles and a decrease in speed to about 75%. -Followup in 3 weeks for reevaluation. At followup consider a MPJ joint injection to relieve inflammation and pain if this persists.

## 2014-03-12 NOTE — Progress Notes (Signed)
  Mary Hughes - 53 y.o. female MRN [id number]  Date of birth: 19-Nov-1960  SUBJECTIVE:  Including CC & ROS.  Patient is a healthy 53 year old female past medical history significant for left total hip replacement in 2006. Patient reports that ever since her hip replacement she maintains physical activity with walking approximately 4-6 miles daily. Proximally 6 weeks ago she developed pain and swelling between her first and third toe with walking it was not provoked by any MOI. She reports that she was seen at the [facility] orthopedic office and was diagnosed with metatarsalgia and provided with a metatarsal pad. The patient tried to place the pad in the appropriate position herself and then one on a 4 mile walk. This was last Thursday she reported that after that walk she developed severe pain in between the second and third toe localizing it to the MPJ joint particularly with toeing off. She associates and swelling bruising and point tenderness. Patient was then seen at urgent care on 03/05/14 had repeat x-rays that show no evidence of fracture or bony abnormalities urgent care with suspected synovitis caused by gout in place the patient called to scene and placed her in a postop shoe. Patient reports that she did not take the colchicine and instead wore a cam walking boot she had at home for the last week with some improvement but no complete resolution of her pain.    ROS: Review of systems otherwise negative except for information present in HPI  HISTORY: Past Medical, Surgical, Social, and Family History Reviewed & Updated per EMR. Pertinent Historical Findings include: Left total hip replacement in 2006 Recently diagnosed hypertension at urgent care  DATA REVIEWED: Personally reviewed patient's left foot x-rays done in urgent care that did not reveal any obvious stress fracture  PHYSICAL EXAM:  VS: BP:140/88 mmHg  HR: bpm  TEMP: ( )  RESP:   HT:5' 6.5" (168.9 cm)   WT:140 lb (63.504 kg)   BMI:22.3 FOOT/ ANKLE EXAM:  General: well nourished Skin of LE: warm; dry, no rashes, lesions, ecchymosis or erythema. Vascular: Dorsal pedal pulses 2+ bilaterally Neurologically: Sensation to light touch lower extremities equal and intact bilaterally.  Foot/Ankle Exam:  Observation -Mild ecchymosis and swelling at the base of the second MPJ Palpation: Tenderness to palpation over the second MPJ, no tenderness over the second third or first metatarsal, no tenderness at the PIP or DIP joints. No tenderness in the ankle with palpation. No tenderness to palpation to Lisfranc joint space. ROM: Normal ankle motion and phalanx/ metatarsals. Pain with second MPJ extending and were flexion Muscle strength: Normal strength in dorsiflexion, plantar flexion, inversion, eversion and phalanx extension/flexion 5/5 strength bilaterally.   Normal weight bearing arch with mild collapse Gait analysis revealed supination at the mid foot and more significant hyperextension at the forefoot with power walking  MSK [place]: Patient second MPJ joint space reveals pocket of hypoechoic fluid extending mildly into the top of the metatarsal. No evidence of stress fracture within the second metatarsal, third metatarsal, or second phalanx  ASSESSMENT & PLAN: See problem based charting & AVS for pt instructions.

## 2014-03-19 ENCOUNTER — Ambulatory Visit
Admission: RE | Admit: 2014-03-19 | Discharge: 2014-03-19 | Disposition: A | Discharge status: Home / Self Care | Payer: BC Managed Care – PPO | Source: Ambulatory Visit | Attending: Family Medicine | Admitting: Family Medicine

## 2014-03-19 DIAGNOSIS — I10 Essential (primary) hypertension: Secondary | ICD-10-CM

## 2014-04-02 ENCOUNTER — Encounter: Payer: Self-pay | Admitting: Sports Medicine

## 2014-04-02 ENCOUNTER — Ambulatory Visit (INDEPENDENT_AMBULATORY_CARE_PROVIDER_SITE_OTHER): Payer: BC Managed Care – PPO | Admitting: Sports Medicine

## 2014-04-02 VITALS — BP 141/97 | Ht 66.5 in | Wt 139.0 lb

## 2014-04-02 DIAGNOSIS — S93529D Sprain of metatarsophalangeal joint of unspecified toe(s), subsequent encounter: Secondary | ICD-10-CM | POA: Diagnosis not present

## 2014-04-02 DIAGNOSIS — Z96649 Presence of unspecified artificial hip joint: Secondary | ICD-10-CM | POA: Insufficient documentation

## 2014-04-02 NOTE — Progress Notes (Signed)
  Mary Hughes - 53 y.o. female MRN [id number]  Date of birth: 03-21-1961  SUBJECTIVE:  Including CC & ROS.  Patient is a healthy 53 year old female following up on metatarsalgia particularly at the second metatarsal. At our last office visit patient was fitted for a metatarsal pad, sports insoles with arch support, crosstraining with decrease in mileage in intensity and walking. Since these adjustments patient reports she has had significant improvement in her pain. She no longer having swelling and tenderness at the dorsum of the second MTP joint. She has less pain with toeing off on walks and pliates. And although the plantar aspect of her foot continues to have some soreness particularly over the second metatarsal head the pain has reduced significantly. She was able to participate in Pilates class with only minimal soreness after. The bruising and point tenderness has resolved. She continues to deny any numbness and tingling sensation between her first and second ray. However she does have callus formation in this plantar surface of the second metatarsal head.    ROS: Review of systems otherwise negative except for information present in HPI  HISTORY: Past Medical, Surgical, Social, and Family History Reviewed & Updated per EMR. Pertinent Historical Findings include: Left total hip replacement in 2006 Recently diagnosed hypertension at urgent care Patient was then seen at urgent care on 03/05/14 had repeat x-rays that show no evidence of fracture or bony abnormalities   DATA REVIEWED: Personally reviewed patient's left foot x-rays done in urgent care that did not reveal any obvious stress fracture  PHYSICAL EXAM:  VS: BP:141/97 mmHg  HR: bpm  TEMP: ( )  RESP:   HT:5' 6.5" (168.9 cm)   WT:139 lb (63.05 kg)  BMI:22.1 FOOT EXAM:  General: well nourished Skin of LE: warm; dry, no rashes, lesions, ecchymosis or erythema. Vascular: Dorsal pedal pulses 2+ bilaterally Neurologically:  Sensation to light touch lower extremities equal and intact bilaterally.  Observation - resolved ecchymosis and swelling at the base of the second MTP Palpation: Resolution of TTP at 2nd MTP joint, no tenderness over the second third or first metatarsal base, no tenderness at the PIP or DIP joints. No tenderness in the ankle with palpation. No tenderness to palpation to Lisfranc joint space and no numbness  Normal ankle motion and strength bilaterally  Extension/flexion 5/5 strength bilaterally in toes Weight-bearing foot exam:  First ray: nuetral  Second ray: sight hammering Longitudinal arch: collapse Heel position: valgus Gait analysis:  Striking location: midfoot Foot motion: pronation Knee motion: neutral Hip motion: external rotation  ASSESSMENT & PLAN: See problem based charting & AVS for pt instructions.

## 2014-04-02 NOTE — Assessment & Plan Note (Addendum)
Clinical improved since last visit with intervention of  metatarsal pad, sports insoles with arch support, crosstraining   Recommendations: Discontinue metatarsal pad and provided patient with a U-shaped pad over the dorsum of the second and third metatarsal head Continue sports insoles consider custom orthotics at followup visit to visualize patient's ankle if changing shoes into a more cushioned tennis shoes is not improved her metatarsalgia F/u in 4-5 weeks  Greater than 25 minutes was spend face to face coordinating care and adjusting patient's insoles to provide maximum comfort

## 2014-04-06 ENCOUNTER — Other Ambulatory Visit: Payer: Self-pay

## 2014-04-06 DIAGNOSIS — Z1231 Encounter for screening mammogram for malignant neoplasm of breast: Secondary | ICD-10-CM

## 2014-04-16 ENCOUNTER — Ambulatory Visit (INDEPENDENT_AMBULATORY_CARE_PROVIDER_SITE_OTHER): Payer: BC Managed Care – PPO | Admitting: Sports Medicine

## 2014-04-16 ENCOUNTER — Encounter: Payer: Self-pay | Admitting: Sports Medicine

## 2014-04-16 VITALS — BP 131/89 | HR 74 | Ht 66.0 in | Wt 140.0 lb

## 2014-04-16 DIAGNOSIS — S93529D Sprain of metatarsophalangeal joint of unspecified toe(s), subsequent encounter: Secondary | ICD-10-CM

## 2014-04-16 DIAGNOSIS — M2042 Other hammer toe(s) (acquired), left foot: Secondary | ICD-10-CM | POA: Insufficient documentation

## 2014-04-16 NOTE — Assessment & Plan Note (Signed)
Patient still having intermittent pain from her 2nd MTP joint hammering. Mostly clinically improved.  Recommends: Patient was given a hammer toe pad to pull the PIP joint down Re-adjusted the U-shaped pain to be on the bottom of her Hoka tennis shoe insole Plan to F/u up when needed

## 2014-04-16 NOTE — Progress Notes (Signed)
  Mary Hughes - 53 y.o. female MRN [id number]  Date of birth: Oct 27, 1960  SUBJECTIVE:  Including CC & ROS.  Patient is a healthy 53 year old female following up on metatarsalgia and hammering at the second metatarsal and 2nd MTP joint. At our last office visit patient was fitted for a metatarsal pad, sports insoles with arch support, crosstraining with decrease in mileage in intensity and walking. She was also given a u-shaped pad. Patient also started wear thicker padded tennis shoes "Hoka". Since these adjustments patient reports she has had significant improvement in her pain.   She no longer having swelling. Does report intermittent tenderness at the dorsum of the second MTP joint when wear other shoes such as her golf shoes.  The bruising and point tenderness has resolved.  She continues to deny any numbness and tingling sensation between her first and second ray. However she does have callus formation in this plantar surface of the second metatarsal head.    ROS: Review of systems otherwise negative except for information present in HPI  HISTORY: Past Medical, Surgical, Social, and Family History Reviewed & Updated per EMR. Pertinent Historical Findings include: Left total hip replacement in 2006 Recently diagnosed hypertension at urgent care Patient was then seen at urgent care on 03/05/14 had repeat x-rays that show no evidence of fracture or bony abnormalities   DATA REVIEWED: Personally reviewed patient's left foot x-rays done in urgent care that did not reveal any obvious stress fracture  PHYSICAL EXAM:  VS: BP:131/89 mmHg  HR:74bpm  TEMP: ( )  RESP:   HT:5\' 6"  (167.6 cm)   WT:140 lb (63.504 kg)  BMI:22.6 FOOT EXAM:  General: well nourished Skin of LE: warm; dry, no rashes, lesions, ecchymosis or erythema. Vascular: Dorsal pedal pulses 2+ bilaterally Neurologically: Sensation to light touch lower extremities equal and intact bilaterally.  Observation - resolved  ecchymosis and swelling at the base of the second MTP Palpation: Resolution of TTP at 2nd MTP joint, no tenderness over the second third or first metatarsal base, no tenderness at the PIP or DIP joints. No tenderness in the ankle with palpation. No tenderness to palpation to Lisfranc joint space and no numbness  Normal ankle motion and strength bilaterally  Extension/flexion 5/5 strength bilaterally in toes Weight-bearing foot exam:  First ray: nuetral  Second ray: sight hammering Longitudinal arch: collapse Heel position: valgus Gait analysis:  Striking location: midfoot Foot motion: pronation Knee motion: neutral Hip motion: external rotation  ASSESSMENT & PLAN: See problem based charting & AVS for pt instructions.

## 2014-04-20 ENCOUNTER — Encounter: Payer: Self-pay | Admitting: Sports Medicine

## 2014-05-18 ENCOUNTER — Ambulatory Visit
Admission: RE | Admit: 2014-05-18 | Discharge: 2014-05-18 | Disposition: A | Discharge status: Home / Self Care | Payer: BC Managed Care – PPO | Source: Ambulatory Visit

## 2014-05-18 DIAGNOSIS — Z1231 Encounter for screening mammogram for malignant neoplasm of breast: Secondary | ICD-10-CM

## 2015-01-18 ENCOUNTER — Ambulatory Visit (INDEPENDENT_AMBULATORY_CARE_PROVIDER_SITE_OTHER): Payer: BLUE CROSS/BLUE SHIELD | Admitting: Gynecology

## 2015-01-18 ENCOUNTER — Encounter: Payer: Self-pay | Admitting: Gynecology

## 2015-01-18 VITALS — BP 120/80 | Ht 66.0 in | Wt 148.0 lb

## 2015-01-18 DIAGNOSIS — Z01419 Encounter for gynecological examination (general) (routine) without abnormal findings: Secondary | ICD-10-CM

## 2015-01-18 LAB — TSH: TSH: 4.617 u[IU]/mL — ABNORMAL HIGH (ref 0.350–4.500)

## 2015-01-18 LAB — CBC WITH DIFFERENTIAL/PLATELET
Basophils Absolute: 0 10*3/uL (ref 0.0–0.1)
Basophils Relative: 0 % (ref 0–1)
EOS ABS: 0.1 10*3/uL (ref 0.0–0.7)
Eosinophils Relative: 1 % (ref 0–5)
HCT: 38.2 % (ref 36.0–46.0)
HEMOGLOBIN: 12.9 g/dL (ref 12.0–15.0)
LYMPHS PCT: 22 % (ref 12–46)
Lymphs Abs: 1.5 10*3/uL (ref 0.7–4.0)
MCH: 31.7 pg (ref 26.0–34.0)
MCHC: 33.8 g/dL (ref 30.0–36.0)
MCV: 93.9 fL (ref 78.0–100.0)
MONO ABS: 0.6 10*3/uL (ref 0.1–1.0)
MPV: 9.9 fL (ref 8.6–12.4)
Monocytes Relative: 9 % (ref 3–12)
NEUTROS PCT: 68 % (ref 43–77)
Neutro Abs: 4.6 10*3/uL (ref 1.7–7.7)
PLATELETS: 272 10*3/uL (ref 150–400)
RBC: 4.07 MIL/uL (ref 3.87–5.11)
RDW: 13.5 % (ref 11.5–15.5)
WBC: 6.7 10*3/uL (ref 4.0–10.5)

## 2015-01-18 LAB — COMPREHENSIVE METABOLIC PANEL
ALBUMIN: 4.3 g/dL (ref 3.6–5.1)
ALT: 14 U/L (ref 6–29)
AST: 30 U/L (ref 10–35)
Alkaline Phosphatase: 54 U/L (ref 33–130)
BUN: 16 mg/dL (ref 7–25)
CO2: 24 mmol/L (ref 20–31)
Calcium: 9.5 mg/dL (ref 8.6–10.4)
Chloride: 103 mmol/L (ref 98–110)
Creat: 0.62 mg/dL (ref 0.50–1.05)
GLUCOSE: 84 mg/dL (ref 65–99)
POTASSIUM: 4.8 mmol/L (ref 3.5–5.3)
Sodium: 137 mmol/L (ref 135–146)
Total Bilirubin: 0.8 mg/dL (ref 0.2–1.2)
Total Protein: 7.3 g/dL (ref 6.1–8.1)

## 2015-01-18 LAB — LIPID PANEL
Cholesterol: 172 mg/dL (ref 125–200)
HDL: 73 mg/dL (ref >=46)
LDL Cholesterol: 87 mg/dL (ref <130)
Total CHOL/HDL Ratio: 2.4 Ratio (ref <=5.0)
Triglycerides: 60 mg/dL (ref <150)
VLDL: 12 mg/dL (ref <30)

## 2015-01-18 LAB — FOLLICLE STIMULATING HORMONE: FSH: 5.3 m[IU]/mL

## 2015-01-18 NOTE — Patient Instructions (Signed)
You may obtain a copy of any labs that were done today by logging onto MyChart as outlined in the instructions provided with your AVS (after visit summary). The office will not call with normal lab results but certainly if there are any significant abnormalities then we will contact you.   Health Maintenance, Female A healthy lifestyle and preventative care can promote health and wellness.  Maintain regular health, dental, and eye exams.  Eat a healthy diet. Foods like vegetables, fruits, whole grains, low-fat dairy products, and lean protein foods contain the nutrients you need without too many calories. Decrease your intake of foods high in solid fats, added sugars, and salt. Get information about a proper diet from your caregiver, if necessary.  Regular physical exercise is one of the most important things you can do for your health. Most adults should get at least 150 minutes of moderate-intensity exercise (any activity that increases your heart rate and causes you to sweat) each week. In addition, most adults need muscle-strengthening exercises on 2 or more days a week.   Maintain a healthy weight. The body mass index (BMI) is a screening tool to identify possible weight problems. It provides an estimate of body fat based on height and weight. Your caregiver can help determine your BMI, and can help you achieve or maintain a healthy weight. For adults 20 years and older:  A BMI below 18.5 is considered underweight.  A BMI of 18.5 to 24.9 is normal.  A BMI of 25 to 29.9 is considered overweight.  A BMI of 30 and above is considered obese.  Maintain normal blood lipids and cholesterol by exercising and minimizing your intake of saturated fat. Eat a balanced diet with plenty of fruits and vegetables. Blood tests for lipids and cholesterol should begin at age 61 and be repeated every 5 years. If your lipid or cholesterol levels are high, you are over 50, or you are a high risk for heart  disease, you may need your cholesterol levels checked more frequently.Ongoing high lipid and cholesterol levels should be treated with medicines if diet and exercise are not effective.  If you smoke, find out from your caregiver how to quit. If you do not use tobacco, do not start.  Lung cancer screening is recommended for adults aged 33 80 years who are at high risk for developing lung cancer because of a history of smoking. Yearly low-dose computed tomography (CT) is recommended for people who have at least a 30-pack-year history of smoking and are a current smoker or have quit within the past 15 years. A pack year of smoking is smoking an average of 1 pack of cigarettes a day for 1 year (for example: 1 pack a day for 30 years or 2 packs a day for 15 years). Yearly screening should continue until the smoker has stopped smoking for at least 15 years. Yearly screening should also be stopped for people who develop a health problem that would prevent them from having lung cancer treatment.  If you are pregnant, do not drink alcohol. If you are breastfeeding, be very cautious about drinking alcohol. If you are not pregnant and choose to drink alcohol, do not exceed 1 drink per day. One drink is considered to be 12 ounces (355 mL) of beer, 5 ounces (148 mL) of wine, or 1.5 ounces (44 mL) of liquor.  Avoid use of street drugs. Do not share needles with anyone. Ask for help if you need support or instructions about stopping  the use of drugs.  High blood pressure causes heart disease and increases the risk of stroke. Blood pressure should be checked at least every 1 to 2 years. Ongoing high blood pressure should be treated with medicines, if weight loss and exercise are not effective.  If you are 59 to 52 years old, ask your caregiver if you should take aspirin to prevent strokes.  Diabetes screening involves taking a blood sample to check your fasting blood sugar level. This should be done once every 3  years, after age 91, if you are within normal weight and without risk factors for diabetes. Testing should be considered at a younger age or be carried out more frequently if you are overweight and have at least 1 risk factor for diabetes.  Breast cancer screening is essential preventative care for women. You should practice "breast self-awareness." This means understanding the normal appearance and feel of your breasts and may include breast self-examination. Any changes detected, no matter how small, should be reported to a caregiver. Women in their 66s and 30s should have a clinical breast exam (CBE) by a caregiver as part of a regular health exam every 1 to 3 years. After age 101, women should have a CBE every year. Starting at age 100, women should consider having a mammogram (breast X-ray) every year. Women who have a family history of breast cancer should talk to their caregiver about genetic screening. Women at a high risk of breast cancer should talk to their caregiver about having an MRI and a mammogram every year.  Breast cancer gene (BRCA)-related cancer risk assessment is recommended for women who have family members with BRCA-related cancers. BRCA-related cancers include breast, ovarian, tubal, and peritoneal cancers. Having family members with these cancers may be associated with an increased risk for harmful changes (mutations) in the breast cancer genes BRCA1 and BRCA2. Results of the assessment will determine the need for genetic counseling and BRCA1 and BRCA2 testing.  The Pap test is a screening test for cervical cancer. Women should have a Pap test starting at age 57. Between ages 25 and 35, Pap tests should be repeated every 2 years. Beginning at age 37, you should have a Pap test every 3 years as long as the past 3 Pap tests have been normal. If you had a hysterectomy for a problem that was not cancer or a condition that could lead to cancer, then you no longer need Pap tests. If you are  between ages 50 and 76, and you have had normal Pap tests going back 10 years, you no longer need Pap tests. If you have had past treatment for cervical cancer or a condition that could lead to cancer, you need Pap tests and screening for cancer for at least 20 years after your treatment. If Pap tests have been discontinued, risk factors (such as a new sexual partner) need to be reassessed to determine if screening should be resumed. Some women have medical problems that increase the chance of getting cervical cancer. In these cases, your caregiver may recommend more frequent screening and Pap tests.  The human papillomavirus (HPV) test is an additional test that may be used for cervical cancer screening. The HPV test looks for the virus that can cause the cell changes on the cervix. The cells collected during the Pap test can be tested for HPV. The HPV test could be used to screen women aged 44 years and older, and should be used in women of any age  who have unclear Pap test results. After the age of 55, women should have HPV testing at the same frequency as a Pap test.  Colorectal cancer can be detected and often prevented. Most routine colorectal cancer screening begins at the age of 44 and continues through age 20. However, your caregiver may recommend screening at an earlier age if you have risk factors for colon cancer. On a yearly basis, your caregiver may provide home test kits to check for hidden blood in the stool. Use of a small camera at the end of a tube, to directly examine the colon (sigmoidoscopy or colonoscopy), can detect the earliest forms of colorectal cancer. Talk to your caregiver about this at age 86, when routine screening begins. Direct examination of the colon should be repeated every 5 to 10 years through age 13, unless early forms of pre-cancerous polyps or small growths are found.  Hepatitis C blood testing is recommended for all people born from [birth year] through 1965 and any  individual with known risks for hepatitis C.  Practice safe sex. Use condoms and avoid high-risk sexual practices to reduce the spread of sexually transmitted infections (STIs). Sexually active women aged 36 and younger should be checked for Chlamydia, which is a common sexually transmitted infection. Older women with new or multiple partners should also be tested for Chlamydia. Testing for other STIs is recommended if you are sexually active and at increased risk.  Osteoporosis is a disease in which the bones lose minerals and strength with aging. This can result in serious bone fractures. The risk of osteoporosis can be identified using a bone density scan. Women ages 20 and over and women at risk for fractures or osteoporosis should discuss screening with their caregivers. Ask your caregiver whether you should be taking a calcium supplement or vitamin D to reduce the rate of osteoporosis.  Menopause can be associated with physical symptoms and risks. Hormone replacement therapy is available to decrease symptoms and risks. You should talk to your caregiver about whether hormone replacement therapy is right for you.  Use sunscreen. Apply sunscreen liberally and repeatedly throughout the day. You should seek shade when your shadow is shorter than you. Protect yourself by wearing long sleeves, pants, a wide-brimmed hat, and sunglasses year round, whenever you are outdoors.  Notify your caregiver of new moles or changes in moles, especially if there is a change in shape or color. Also notify your caregiver if a mole is larger than the size of a pencil eraser.  Stay current with your immunizations. Document Released: 12/19/2010 Document Revised: 09/30/2012 Document Reviewed: 12/19/2010 [facility] Patient Information 2014 [employer].

## 2015-01-18 NOTE — Progress Notes (Signed)
Mary Hughes [birth year] [id number]        54 y.o.  G2P2002 for annual exam.  Doing well.  Past medical history,surgical history, problem list, medications, allergies, family history and social history were all reviewed and documented as reviewed in the EPIC chart.  ROS:  Performed with pertinent positives and negatives included in the history, assessment and plan.   Additional significant findings :  none   Exam: Kim Counsellor Vitals:   01/18/15 0903  BP: 120/80  Height: 5\' 6"  (1.676 m)  Weight: 148 lb (67.132 kg)   General appearance:  Normal affect, orientation and appearance. Skin: Grossly normal HEENT: Without gross lesions.  No cervical or supraclavicular adenopathy. Thyroid normal.  Lungs:  Clear without wheezing, rales or rhonchi Cardiac: RR, without RMG Abdominal:  Soft, nontender, without masses, guarding, rebound, organomegaly or hernia Breasts:  Examined lying and sitting without masses, retractions, discharge or axillary adenopathy. Pelvic:  Ext/BUS/vagina normal  Cervix normal  Uterus anteverted, normal size, shape and contour, midline and mobile nontender   Adnexa  Without masses or tenderness    Anus and perineum  Normal   Rectovaginal  Normal sphincter tone without palpated masses or tenderness.    Assessment/Plan:  54 y.o. G10P2002 female for annual exam without menses, vasectomy birth control.   1. Amenorrhea. Status post endometrial ablation in the past. [place] normal last year. Patient asked if we could check [place] this year. Is having occasional hot flushes. [place] TSH ordered. 2. Echogenic right ovarian mass. Patient has 18 mm right echogenic mass unchanged over years observation with ultrasound last year demonstrating stability. Patient's asymptomatic. We'll plan expectant management for now. 3. Pap smear/HPV negative 2015.  No Pap smear done today. No history of significant abnormal Pap smears previously. 4. Mammography 04/2014. Continue with annual  mammography. SBE monthly reviewed. 5. Colonoscopy 2013. Repeat at their recommended interval. 6. DEXA never. Will plan further into the menopause. Check vitamin D level today. 7. Health maintenance. Baseline CBC, comprehensive metabolic panel, lipid profile, urinalysis, TSH, vitamin D, FSH ordered. Follow up in one year, sooner as needed.   Anastasio Auerbach MD, 9:32 AM 01/18/2015

## 2015-01-19 ENCOUNTER — Other Ambulatory Visit: Payer: Self-pay | Admitting: *Deleted

## 2015-01-19 DIAGNOSIS — R7989 Other specified abnormal findings of blood chemistry: Secondary | ICD-10-CM

## 2015-01-19 LAB — URINALYSIS W MICROSCOPIC + REFLEX CULTURE
BILIRUBIN URINE: NEGATIVE
Bacteria, UA: NONE SEEN [HPF]
Casts: NONE SEEN [LPF]
Crystals: NONE SEEN [HPF]
Glucose, UA: NEGATIVE
Hgb urine dipstick: NEGATIVE
Ketones, ur: NEGATIVE
Leukocytes, UA: NEGATIVE
Nitrite: NEGATIVE
PH: 7.5 (ref 5.0–8.0)
Protein, ur: NEGATIVE
RBC / HPF: NONE SEEN RBC/HPF (ref <=2)
Specific Gravity, Urine: 1.013 (ref 1.001–1.035)
WBC, UA: NONE SEEN WBC/HPF (ref <=5)
Yeast: NONE SEEN [HPF]

## 2015-01-19 LAB — VITAMIN D 25 HYDROXY (VIT D DEFICIENCY, FRACTURES): VIT D 25 HYDROXY: 38 ng/mL (ref 30–100)

## 2015-01-21 ENCOUNTER — Other Ambulatory Visit: Payer: BLUE CROSS/BLUE SHIELD

## 2015-01-21 ENCOUNTER — Other Ambulatory Visit: Payer: Self-pay | Admitting: Gynecology

## 2015-01-21 DIAGNOSIS — R7989 Other specified abnormal findings of blood chemistry: Secondary | ICD-10-CM

## 2015-01-21 DIAGNOSIS — E039 Hypothyroidism, unspecified: Secondary | ICD-10-CM

## 2015-01-21 LAB — THYROID PANEL WITH TSH
Free Thyroxine Index: 1 — ABNORMAL LOW (ref 1.4–3.8)
T3 UPTAKE: 24 % (ref 22–35)
T4, Total: 4 ug/dL — ABNORMAL LOW (ref 4.5–12.0)
TSH: 2.564 u[IU]/mL (ref 0.350–4.500)

## 2015-01-22 ENCOUNTER — Telehealth: Payer: Self-pay | Admitting: *Deleted

## 2015-01-22 DIAGNOSIS — R7989 Other specified abnormal findings of blood chemistry: Secondary | ICD-10-CM

## 2015-01-22 LAB — THYROGLOBULIN LEVEL: THYROGLOBULIN: 54.2 ng/mL — AB (ref 2.8–40.9)

## 2015-01-22 LAB — THYROID PEROXIDASE ANTIBODY

## 2015-01-22 NOTE — Telephone Encounter (Signed)
-----   Message from Ramond Craver, [place] sent at 01/22/2015 11:46 AM EDT ----- Regarding: referral to [facility] endocrinology Per Dr. Loetta Rough "Tell patient that her thyroid panel repeat showed a normal TSH. Her thyroid antibodies that were elevated. She was smart to request these. Recommend follow up with endocrinologist for evaluation. Set up an appointment with [facility] endocrinology ."  Patient has been notified. Will wait to hear regarding appt. Thanks!

## 2015-01-22 NOTE — Telephone Encounter (Signed)
Referral placed they will contact pt to schedule. 

## 2015-01-25 NOTE — Telephone Encounter (Signed)
Appointment on 03/12/15 @ 8:15am

## 2015-03-12 ENCOUNTER — Ambulatory Visit (INDEPENDENT_AMBULATORY_CARE_PROVIDER_SITE_OTHER): Payer: BC Managed Care – PPO | Admitting: Internal Medicine

## 2015-03-12 ENCOUNTER — Other Ambulatory Visit (INDEPENDENT_AMBULATORY_CARE_PROVIDER_SITE_OTHER): Payer: BC Managed Care – PPO | Admitting: *Deleted

## 2015-03-12 ENCOUNTER — Encounter: Payer: Self-pay | Admitting: Internal Medicine

## 2015-03-12 VITALS — BP 108/62 | HR 80 | Temp 97.7°F | Resp 12 | Ht 66.5 in | Wt 145.2 lb

## 2015-03-12 DIAGNOSIS — Z23 Encounter for immunization: Secondary | ICD-10-CM

## 2015-03-12 DIAGNOSIS — E063 Autoimmune thyroiditis: Secondary | ICD-10-CM | POA: Insufficient documentation

## 2015-03-12 LAB — T4, FREE: FREE T4: 0.61 ng/dL (ref 0.60–1.60)

## 2015-03-12 LAB — T3, FREE: T3 FREE: 3.2 pg/mL (ref 2.3–4.2)

## 2015-03-12 LAB — TSH: TSH: 3.55 u[IU]/mL (ref 0.35–4.50)

## 2015-03-12 NOTE — Progress Notes (Signed)
Patient ID: [name], female   DOB: 12/15/1960, 54 y.o.   MRN: [id number]   HPI  Mary Hughes is a 54 y.o.-year-old female, referred by her ObGyn Dr., Dr Mary Hughes, for management of Hashimoto's thyroiditis.  Pt. has been dx with Hashimoto's thyroiditis hypothyroidism in 01/2015; she is not on Levothyroxine.  Retrospectively, she had a thyroid U/S in 2002 for enlarged thyroid >> aspect of the thyroid was heterogeneous >> thyroid inflammation has been present since then. There were no thyroid nodules.  I reviewed pt's thyroid tests: Lab Results  Component Value Date   TSH 2.564 01/21/2015   TSH 4.617* 01/18/2015   TSH 3.873 01/09/2014   TSH 2.392 01/08/2013  01/21/2015: TPO antibodies >900  Pt describes: - + weight gain - + fatigue - + intermittent dizziness/+ lightheadedness - no cold intolerance, + occasional hot flushes - no depression/no anxiety - no constipation - + SOB with exertion - no dry skin - no hair loss  She exercises - walking 4 miles a day, spin classes, pilates.  Pt denies feeling nodules in neck, hoarseness, dysphagia/odynophagia, + occasional SOB with lying down and with going up stairs.  She has + FH of thyroid disorders in: sister with Hashimoto's thyroiditis, + thyroid nodule in daughter.  No FH of thyroid cancer. No h/o radiation tx to head or neck. No recent use of iodine supplements.  I reviewed her chart and she also has a history of HTN (dx 2016), GERD.   ROS: Constitutional: + see HPI Eyes: no blurry vision, no xerophthalmia ENT: no sore throat, no nodules palpated in throat, no dysphagia/odynophagia, no hoarseness, + tinnitus Cardiovascular: no CP/+ SOB/+ palpitations - with exertion/no leg swelling Respiratory: no cough/+ SOB occasionally Gastrointestinal: no N/V/D/C/+ heartburn Musculoskeletal: no muscle/joint aches Skin: no rashes, + easy bruising Neurological: no tremors/numbness/tingling/dizziness Psychiatric: no  depression/anxiety  Past Medical History  Diagnosis Date  . S/P endometrial ablation 10/07  . Ovarian mass 06/2005    RIGHT OVARIAN ECHOGENIC MASS  . Hypertension    Past Surgical History  Procedure Laterality Date  . Hysteroscopy  04/18/2006    HYST, D&C WITH NOVASURE  . Endometrial ablation  04/18/2006  . Pilonidal cyst drainage      Removed  . Hip surgery  08/2004    LEFT HIP REPLACEMENT  . Joint replacement      left hip  . Colonoscopy  04/03/2012    Procedure: COLONOSCOPY;  Surgeon: Wonda Horner, MD;  Location: [facility];  Service: Endoscopy;  Laterality: N/A;   Social History   Social History  . Marital Status: Married    Spouse Name: N/A  . Number of Children: 2   Occupational History  . COO at [employer]   Social History Main Topics  . Smoking status: Never Smoker   . Smokeless tobacco: Never Used  . Alcohol Use: 6.0 oz/week    Wine, beer - 5x a week, 2-3 Standard drinks or equivalent  . Drug Use: No  . Sexual Activity: Yes    Birth Control/ Protection: Other-see comments     Comment: Vasectomy-1st intercourse 54 yo-Fewer than 5 partners   Current Outpatient Prescriptions on File Prior to Visit  Medication Sig Dispense Refill  . lisinopril (PRINIVIL,ZESTRIL) 10 MG tablet     . MINOCYCLINE HCL ER PO Take by mouth.     No current facility-administered medications on file prior to visit.   No Known Allergies Family History  Problem Relation Age of Onset  .  Hypertension Mother   . Lung cancer Mother   . Heart disease Father   . Diabetes Maternal Grandmother   . Heart disease Maternal Grandfather    PE: BP 108/62 mmHg  Pulse 80  Temp(Src) 97.7 F (36.5 C) (Oral)  Resp 12  Ht 5' 6.5" (1.689 m)  Wt 145 lb 3.2 oz (65.862 kg)  BMI 23.09 kg/m2  SpO2 97% Wt Readings from Last 3 Encounters:  03/12/15 145 lb 3.2 oz (65.862 kg)  01/18/15 148 lb (67.132 kg)  04/16/14 140 lb (63.504 kg)   Constitutional: overweight, in NAD Eyes: PERRLA,  EOMI, no exophthalmos ENT: moist mucous membranes, no thyromegaly, no cervical lymphadenopathy Cardiovascular: RRR, No MRG Respiratory: CTA B Gastrointestinal: abdomen soft, NT, ND, BS+ Musculoskeletal: no deformities, strength intact in all 4 Skin: moist, warm, no rashes Neurological: slight tremor with outstretched hands (coffe this am), DTR normal in all 4  ASSESSMENT: 1. Hashimoto thyroiditis - Thyroid ultrasound (07/26/2000):  THE THYROID GLAND IS MILDLY HETEROGENEOUS WITHOUT DEFINITE FOCAL LESIONS OR CYSTS.   THE RIGHT LOBEOF THE THYROID MEASURES 1.1 X 1.5 X 4.5 CM   THE LEFT LOBE OF THE THYROID MEASURES 1.4 X 1.1 X 4.3 CM.   NO OTHER ABNORMALITIES ARE IDENTIFIED.  PLAN: 1. Hashimoto thyroiditis - I reviewed the report of her 2002 thyroid ultrasound along with the patient. I pointed out that her thyroid gland appears heterogeneous, indicative of inflammation. Pt does not have a thyroid cancer family history or a personal history of RxTx to head/neck, so is not at high risk for [facility]. - we had a long discussion about her Hashimoto thyroiditis diagnosis. I explained that this is an autoimmune disorder, in which she develops antibodies against her own thyroid. The antibodies bind to the thyroid tissue and cause inflammation, and, eventually, destruction of the gland and hypothyroidism. We don't know how long this process can be, it can last from months to years. As of now, based on the last results that I have, her thyroid tests are normal. We will repeat them today. - I also explained that thyroid enlargement especially at the beginning of her Hashimoto thyroiditis course is not uncommon, and it has a waxing and waning character. She has occasional SOB and a neck constrictive sensation, not unusual for Hashimoto's thyroiditis. - We discussed about treatment for Hashimoto thyroiditis, which is actually limited to thyroid hormones in case her TFTs are abnormal. Supplements like selenium  has been tried with various results, some showing improvement in the TPO antibodies. However, there are no randomized controlled trials of this or consistent results between trials. We also discussed about ways to improve her immune system (relaxation, diet, exercise, sleep) to reduce the Ab titer and, subsequently, the thyroid inflammation. - Will check thyroid tests now and have her return in 6 mo for repeat.   CC:  Dr Wynonia Lawman - cardiology Dr Mary Hughes - Harle Battiest  Office Visit on 03/12/2015  Component Date Value Ref Range Status  . TSH 03/12/2015 3.55  0.35 - 4.50 uIU/mL Final  . Free T4 03/12/2015 0.61  0.60 - 1.60 ng/dL Final  . T3, Free 03/12/2015 3.2  2.3 - 4.2 pg/mL Final   Thyroid tests are normal. We'll repeat them when she returns in 6 months. No intervention needed for now.

## 2015-03-12 NOTE — Patient Instructions (Signed)
Please stop at the lab.  Please come back for a follow-up appointment in 6 months.  

## 2015-04-14 ENCOUNTER — Other Ambulatory Visit: Payer: Self-pay

## 2015-04-14 DIAGNOSIS — Z1231 Encounter for screening mammogram for malignant neoplasm of breast: Secondary | ICD-10-CM

## 2015-05-21 ENCOUNTER — Ambulatory Visit: Payer: BC Managed Care – PPO

## 2015-06-18 ENCOUNTER — Ambulatory Visit
Admission: RE | Admit: 2015-06-18 | Discharge: 2015-06-18 | Disposition: A | Discharge status: Home / Self Care | Payer: BLUE CROSS/BLUE SHIELD | Source: Ambulatory Visit

## 2015-06-18 DIAGNOSIS — Z1231 Encounter for screening mammogram for malignant neoplasm of breast: Secondary | ICD-10-CM

## 2015-09-09 ENCOUNTER — Ambulatory Visit (INDEPENDENT_AMBULATORY_CARE_PROVIDER_SITE_OTHER): Payer: BLUE CROSS/BLUE SHIELD | Admitting: Internal Medicine

## 2015-09-09 ENCOUNTER — Encounter: Payer: Self-pay | Admitting: Internal Medicine

## 2015-09-09 VITALS — BP 112/76 | HR 79 | Temp 97.7°F | Resp 12 | Wt 148.0 lb

## 2015-09-09 DIAGNOSIS — E063 Autoimmune thyroiditis: Secondary | ICD-10-CM | POA: Diagnosis not present

## 2015-09-09 LAB — T3, FREE: T3, Free: 3.1 pg/mL (ref 2.3–4.2)

## 2015-09-09 LAB — T4, FREE: FREE T4: 0.69 ng/dL (ref 0.60–1.60)

## 2015-09-09 LAB — TSH: TSH: 4.91 u[IU]/mL — ABNORMAL HIGH (ref 0.35–4.50)

## 2015-09-09 NOTE — Patient Instructions (Signed)
Please stop at the lab.  Continue Selenium 200 mg daily.  Please return in 1 year for a visit and in 6 months for labs (TSH).

## 2015-09-09 NOTE — Progress Notes (Signed)
Patient ID: Mary Hughes, female   DOB: Jul 03, 1960, 55 y.o.   MRN: [id number]   HPI  Mary Hughes is a 55 y.o.-year-old female, initially referred by her ObGyn Dr., Dr Phineas Real, returning for f/u for euthyroid Hashimoto's thyroiditis. Last visit 6 mo ago.  Reviewed hx: Pt. has been dx with Hashimoto's thyroiditis hypothyroidism in 01/2015; she is not on Levothyroxine.  Retrospectively, she had a thyroid U/S in 2002 for enlarged thyroid >> aspect of the thyroid was heterogeneous >> thyroid inflammation has been present since then. There were no thyroid nodules.  I reviewed pt's thyroid tests: Lab Results  Component Value Date   TSH 3.55 03/12/2015   TSH 2.564 01/21/2015   TSH 4.617* 01/18/2015   TSH 3.873 01/09/2014   TSH 2.392 01/08/2013   FREET4 0.61 03/12/2015  01/21/2015: TPO antibodies >900  She started Selenium 200 mg daily since last visit, stopped coffee and started a MVI.  Pt feels great: - no weight gain - no fatigue - no dizziness/lightheadedness - no cold intolerance - no depression/no anxiety - no constipation - no dry skin - no hair loss  She exercises - walking 4 miles a day, spin classes, pilates.  Pt denies feeling nodules in neck, hoarseness, dysphagia/odynophagia, no SOB with lying down and with going up stairs.  She has + FH of thyroid disorders in: sister with Hashimoto's thyroiditis, + thyroid nodule in daughter.  No FH of thyroid cancer. No h/o radiation tx to head or neck. No recent use of iodine supplements.  I reviewed her chart and she also has a history of HTN (dx 2016), GERD.   She reduced Lisinopril from 10 mg to 5 mg.  ROS: Constitutional: no weight gain/loss, no fatigue, no subjective hyperthermia/hypothermia Eyes: no blurry vision, no xerophthalmia ENT: no sore throat, no nodules palpated in throat, no dysphagia/odynophagia, no hoarseness Cardiovascular: no CP/SOB/palpitations/leg swelling Respiratory: no  cough/SOB Gastrointestinal: no N/V/D/C Musculoskeletal: no muscle/joint aches Skin: no rashes Neurological: no tremors/numbness/tingling/dizziness  I reviewed pt's medications, allergies, PMH, social hx, family hx, and changes were documented in the history of present illness. Otherwise, unchanged from my initial visit note.  Past Medical History  Diagnosis Date  . S/P endometrial ablation 10/07  . Ovarian mass 06/2005    RIGHT OVARIAN ECHOGENIC MASS  . Hypertension    Past Surgical History  Procedure Laterality Date  . Hysteroscopy  04/18/2006    HYST, D&C WITH NOVASURE  . Endometrial ablation  04/18/2006  . Pilonidal cyst drainage      Removed  . Hip surgery  08/2004    LEFT HIP REPLACEMENT  . Joint replacement      left hip  . Colonoscopy  04/03/2012    Procedure: COLONOSCOPY;  Surgeon: Wonda Horner, MD;  Location: [facility];  Service: Endoscopy;  Laterality: N/A;   Social History   Social History  . Marital Status: Married    Spouse Name: N/A  . Number of Children: 2   Occupational History  . COO at [employer]   Social History Main Topics  . Smoking status: Never Smoker   . Smokeless tobacco: Never Used  . Alcohol Use: 6.0 oz/week    Wine, beer - 5x a week, 2-3 Standard drinks or equivalent  . Drug Use: No  . Sexual Activity: Yes    Birth Control/ Protection: Other-see comments     Comment: Vasectomy-1st intercourse 55 yo-Fewer than 5 partners   Current Outpatient Prescriptions on File Prior to Visit  Medication Sig Dispense Refill  . cholecalciferol (VITAMIN D) 1000 UNITS tablet Take 1,000 Units by mouth daily.    Marland Kitchen MINOCYCLINE HCL ER PO Take by mouth.    . Probiotic Product (PROBIOTIC ACIDOPHILUS) CAPS Take 1 capsule by mouth daily.     No current facility-administered medications on file prior to visit.   No Known Allergies Family History  Problem Relation Age of Onset  . Hypertension Mother   . Lung cancer Mother   . Heart disease  Father   . Diabetes Maternal Grandmother   . Heart disease Maternal Grandfather    PE: BP 112/76 mmHg  Pulse 79  Temp(Src) 97.7 F (36.5 C) (Oral)  Resp 12  Wt 148 lb (67.132 kg)  SpO2 99% Body mass index is 23.53 kg/(m^2). Wt Readings from Last 3 Encounters:  09/09/15 148 lb (67.132 kg)  03/12/15 145 lb 3.2 oz (65.862 kg)  01/18/15 148 lb (67.132 kg)   Constitutional: overweight, in NAD Eyes: PERRLA, EOMI, no exophthalmos ENT: moist mucous membranes, no thyromegaly, no cervical lymphadenopathy Cardiovascular: RRR, No MRG Respiratory: CTA B Gastrointestinal: abdomen soft, NT, ND, BS+ Musculoskeletal: no deformities, strength intact in all 4 Skin: moist, warm, no rashes Neurological: no tremor with outstretched hands, DTR normal in all 4  ASSESSMENT: 1. Hashimoto thyroiditis - Thyroid ultrasound (07/26/2000):  THE THYROID GLAND IS MILDLY HETEROGENEOUS WITHOUT DEFINITE FOCAL LESIONS OR CYSTS.   THE RIGHT LOBEOF THE THYROID MEASURES 1.1 X 1.5 X 4.5 CM   THE LEFT LOBE OF THE THYROID MEASURES 1.4 X 1.1 X 4.3 CM.   NO OTHER ABNORMALITIES ARE IDENTIFIED.  PLAN: 1. Hashimoto thyroiditis - we discussed about her Hashimoto thyroiditis diagnosis >> autoimmune disorder, in which she develops antibodies against her own thyroid >> eventually causing destruction of the gland and hypothyroidism. We don't know how long this process can be, it can last from months to years. As of now, based on the last TFTs, her thyroid tests are normal. We will repeat them today. As she started Selenium since last visit >> I will also repeat the TPO Abs to see if they decreased. - Will check thyroid tests now and have her return in 1 year for repeat. I alsoadvised her to get a new TSH in 6 mo, at next appt with PCP.   CC:  Dr Wynonia Lawman - cardiology Dr Phineas Real - ObGyn  Component     Latest Ref Rng 09/09/2015  TSH     0.35 - 4.50 uIU/mL 4.91 (H)  T4,Free(Direct)     0.60 - 1.60 ng/dL 0.69   Triiodothyronine,Free,Serum     2.3 - 4.2 pg/mL 3.1  Subclinical hypothyroidism. No need to start levothyroxine quite now. That I would like to repeat her testing 2 months and may need to start at that point, if TSH is still high. TPO antibodies still pending.

## 2015-09-10 LAB — THYROID PEROXIDASE ANTIBODY

## 2015-11-09 ENCOUNTER — Other Ambulatory Visit (INDEPENDENT_AMBULATORY_CARE_PROVIDER_SITE_OTHER): Payer: BLUE CROSS/BLUE SHIELD

## 2015-11-09 DIAGNOSIS — E063 Autoimmune thyroiditis: Secondary | ICD-10-CM | POA: Diagnosis not present

## 2015-11-09 LAB — TSH: TSH: 4.15 u[IU]/mL (ref 0.35–4.50)

## 2015-11-09 LAB — T4, FREE: Free T4: 0.62 ng/dL (ref 0.60–1.60)

## 2015-11-09 LAB — T3, FREE: T3, Free: 2.9 pg/mL (ref 2.3–4.2)

## 2015-12-06 DIAGNOSIS — H5213 Myopia, bilateral: Secondary | ICD-10-CM | POA: Diagnosis not present

## 2015-12-23 DIAGNOSIS — I10 Essential (primary) hypertension: Secondary | ICD-10-CM | POA: Diagnosis not present

## 2015-12-23 DIAGNOSIS — E063 Autoimmune thyroiditis: Secondary | ICD-10-CM | POA: Diagnosis not present

## 2015-12-23 DIAGNOSIS — Z86010 Personal history of colonic polyps: Secondary | ICD-10-CM | POA: Diagnosis not present

## 2016-01-19 ENCOUNTER — Encounter: Payer: BLUE CROSS/BLUE SHIELD | Admitting: Gynecology

## 2016-01-25 DIAGNOSIS — D2272 Melanocytic nevi of left lower limb, including hip: Secondary | ICD-10-CM | POA: Diagnosis not present

## 2016-01-25 DIAGNOSIS — D1801 Hemangioma of skin and subcutaneous tissue: Secondary | ICD-10-CM | POA: Diagnosis not present

## 2016-01-25 DIAGNOSIS — D225 Melanocytic nevi of trunk: Secondary | ICD-10-CM | POA: Diagnosis not present

## 2016-01-25 DIAGNOSIS — L570 Actinic keratosis: Secondary | ICD-10-CM | POA: Diagnosis not present

## 2016-01-25 DIAGNOSIS — L821 Other seborrheic keratosis: Secondary | ICD-10-CM | POA: Diagnosis not present

## 2016-03-08 ENCOUNTER — Ambulatory Visit (INDEPENDENT_AMBULATORY_CARE_PROVIDER_SITE_OTHER): Payer: BLUE CROSS/BLUE SHIELD | Admitting: Gynecology

## 2016-03-08 ENCOUNTER — Encounter: Payer: Self-pay | Admitting: Gynecology

## 2016-03-08 VITALS — BP 124/80 | Ht 66.0 in | Wt 143.0 lb

## 2016-03-08 DIAGNOSIS — Z01419 Encounter for gynecological examination (general) (routine) without abnormal findings: Secondary | ICD-10-CM | POA: Diagnosis not present

## 2016-03-08 DIAGNOSIS — E559 Vitamin D deficiency, unspecified: Secondary | ICD-10-CM

## 2016-03-08 LAB — COMPREHENSIVE METABOLIC PANEL
ALT: 19 U/L (ref 6–29)
AST: 36 U/L — ABNORMAL HIGH (ref 10–35)
Albumin: 4.3 g/dL (ref 3.6–5.1)
Alkaline Phosphatase: 58 U/L (ref 33–130)
BUN: 14 mg/dL (ref 7–25)
CO2: 25 mmol/L (ref 20–31)
CREATININE: 0.66 mg/dL (ref 0.50–1.05)
Calcium: 9.4 mg/dL (ref 8.6–10.4)
Chloride: 100 mmol/L (ref 98–110)
Glucose, Bld: 84 mg/dL (ref 65–99)
Potassium: 3.8 mmol/L (ref 3.5–5.3)
SODIUM: 135 mmol/L (ref 135–146)
Total Bilirubin: 1.1 mg/dL (ref 0.2–1.2)
Total Protein: 7.6 g/dL (ref 6.1–8.1)

## 2016-03-08 LAB — CBC WITH DIFFERENTIAL/PLATELET
Basophils Absolute: 0 cells/uL (ref 0–200)
Basophils Relative: 0 %
EOS ABS: 73 {cells}/uL (ref 15–500)
Eosinophils Relative: 1 %
HEMATOCRIT: 36.4 % (ref 35.0–45.0)
Hemoglobin: 12.5 g/dL (ref 11.7–15.5)
LYMPHS PCT: 28 %
Lymphs Abs: 2044 cells/uL (ref 850–3900)
MCH: 32.7 pg (ref 27.0–33.0)
MCHC: 34.3 g/dL (ref 32.0–36.0)
MCV: 95.3 fL (ref 80.0–100.0)
MONO ABS: 511 {cells}/uL (ref 200–950)
MPV: 9.9 fL (ref 7.5–12.5)
Monocytes Relative: 7 %
NEUTROS PCT: 64 %
Neutro Abs: 4672 cells/uL (ref 1500–7800)
Platelets: 282 10*3/uL (ref 140–400)
RBC: 3.82 MIL/uL (ref 3.80–5.10)
RDW: 13.7 % (ref 11.0–15.0)
WBC: 7.3 10*3/uL (ref 3.8–10.8)

## 2016-03-08 NOTE — Progress Notes (Signed)
    CAMIA SCHAEDLER 04/13/1961 [id number]        55 y.o.  G2P2002  for annual exam.  Doing well.  Past medical history,surgical history, problem list, medications, allergies, family history and social history were all reviewed and documented as reviewed in the EPIC chart.  ROS:  Performed with pertinent positives and negatives included in the history, assessment and plan.   Additional significant findings :  None   Exam: [name] assistant Vitals:   03/08/16 1118  BP: 124/80  Weight: 143 lb (64.9 kg)  Height: 5\' 6"  (1.676 m)   Body mass index is 23.08 kg/m.  General appearance:  Normal affect, orientation and appearance. Skin: Grossly normal HEENT: Without gross lesions.  No cervical or supraclavicular adenopathy. Thyroid normal.  Lungs:  Clear without wheezing, rales or rhonchi Cardiac: RR, without RMG Abdominal:  Soft, nontender, without masses, guarding, rebound, organomegaly or hernia Breasts:  Examined lying and sitting without masses, retractions, discharge or axillary adenopathy. Pelvic:  Ext, BUS,Vagina normal  Cervix normal  Uterus anteverted, normal size, shape and contour, midline and mobile nontender   Adnexa without masses or tenderness    Anus and perineum normal   Rectovaginal normal sphincter tone without palpated masses or tenderness.    Assessment/Plan:  55 y.o. G87P2002 female for annual exam without menses, vasectomy birth control.   1. Amenorrhea. Status post endometrial ablation. Jenkins last year was normal. Not having any menopausal symptoms. Continue to monitor and report any bleeding. 2. Pap smear/HPV 2015. No Pap smear done today. No history of abnormal Pap smears. Plan repeat Pap smear approaching 5 year interval per current screening guidelines. 3. Mammography coming due in January and I reminded her to schedule this. SBE monthly reviewed. 4. Colonoscopy coming due next year and she knows to call and schedule. 5. Echogenic right ovarian mass.  History of 18 mm right echogenic ovarian mass followed with serial ultrasounds over years time. Has remained stable and we have decided against further study follow up. 6. Health maintenance. Baseline CBC, CMP, vitamin D, urinalysis ordered. Did have marginally low vitamin D last year. Is on vitamin D supplementation. Lipid profiles of the last 2 years are excellent and not repeated now. Being followed for Hashimoto's thyroiditis elsewhere. Follow up 1 year, sooner as needed.   Anastasio Auerbach MD, 11:40 AM 03/08/2016

## 2016-03-08 NOTE — Patient Instructions (Signed)

## 2016-03-09 ENCOUNTER — Encounter: Payer: Self-pay | Admitting: Gynecology

## 2016-03-09 ENCOUNTER — Other Ambulatory Visit: Payer: Self-pay | Admitting: Internal Medicine

## 2016-03-09 DIAGNOSIS — E063 Autoimmune thyroiditis: Secondary | ICD-10-CM

## 2016-03-09 LAB — VITAMIN D 25 HYDROXY (VIT D DEFICIENCY, FRACTURES): Vit D, 25-Hydroxy: 58 ng/mL (ref 30–100)

## 2016-03-09 LAB — URINALYSIS W MICROSCOPIC + REFLEX CULTURE
BILIRUBIN URINE: NEGATIVE
Bacteria, UA: NONE SEEN [HPF]
CRYSTALS: NONE SEEN [HPF]
Casts: NONE SEEN [LPF]
Glucose, UA: NEGATIVE
Hgb urine dipstick: NEGATIVE
KETONES UR: NEGATIVE
Leukocytes, UA: NEGATIVE
Nitrite: NEGATIVE
PH: 6.5 (ref 5.0–8.0)
Protein, ur: NEGATIVE
RBC / HPF: NONE SEEN RBC/HPF (ref <=2)
SPECIFIC GRAVITY, URINE: 1.007 (ref 1.001–1.035)
SQUAMOUS EPITHELIAL / LPF: NONE SEEN [HPF] (ref <=5)
WBC UA: NONE SEEN WBC/HPF (ref <=5)
Yeast: NONE SEEN [HPF]

## 2016-03-10 ENCOUNTER — Other Ambulatory Visit: Payer: BLUE CROSS/BLUE SHIELD

## 2016-03-16 ENCOUNTER — Other Ambulatory Visit (INDEPENDENT_AMBULATORY_CARE_PROVIDER_SITE_OTHER): Payer: BLUE CROSS/BLUE SHIELD

## 2016-03-16 DIAGNOSIS — E063 Autoimmune thyroiditis: Secondary | ICD-10-CM

## 2016-03-16 LAB — T4, FREE: Free T4: 0.58 ng/dL — ABNORMAL LOW (ref 0.60–1.60)

## 2016-03-16 LAB — T3, FREE: T3, Free: 3.3 pg/mL (ref 2.3–4.2)

## 2016-03-16 LAB — TSH: TSH: 4.43 u[IU]/mL (ref 0.35–4.50)

## 2016-03-17 ENCOUNTER — Encounter: Payer: Self-pay | Admitting: Gynecology

## 2016-03-17 NOTE — Telephone Encounter (Signed)
It is a different reporting system. They are all within the normal range. The previous year 2016 neutrophil absolute is 4.6 but when you look at the units it's 1000 per milliliter which is the same as this year because the 2017 reporting system is cells per milliliter not 1000

## 2016-04-08 DIAGNOSIS — Z23 Encounter for immunization: Secondary | ICD-10-CM | POA: Diagnosis not present

## 2016-04-25 DIAGNOSIS — H25013 Cortical age-related cataract, bilateral: Secondary | ICD-10-CM | POA: Diagnosis not present

## 2016-04-25 DIAGNOSIS — H5213 Myopia, bilateral: Secondary | ICD-10-CM | POA: Diagnosis not present

## 2016-05-16 ENCOUNTER — Other Ambulatory Visit: Payer: Self-pay | Admitting: Gynecology

## 2016-05-16 DIAGNOSIS — Z1231 Encounter for screening mammogram for malignant neoplasm of breast: Secondary | ICD-10-CM

## 2016-05-31 DIAGNOSIS — J069 Acute upper respiratory infection, unspecified: Secondary | ICD-10-CM | POA: Diagnosis not present

## 2016-06-20 ENCOUNTER — Ambulatory Visit: Payer: BLUE CROSS/BLUE SHIELD

## 2016-06-22 ENCOUNTER — Ambulatory Visit: Payer: BLUE CROSS/BLUE SHIELD

## 2016-06-26 ENCOUNTER — Ambulatory Visit
Admission: RE | Admit: 2016-06-26 | Discharge: 2016-06-26 | Disposition: A | Discharge status: Home / Self Care | Payer: BLUE CROSS/BLUE SHIELD | Source: Ambulatory Visit | Attending: Gynecology | Admitting: Gynecology

## 2016-06-26 DIAGNOSIS — Z1231 Encounter for screening mammogram for malignant neoplasm of breast: Secondary | ICD-10-CM | POA: Diagnosis not present

## 2016-07-13 DIAGNOSIS — H25811 Combined forms of age-related cataract, right eye: Secondary | ICD-10-CM | POA: Diagnosis not present

## 2016-07-13 DIAGNOSIS — H268 Other specified cataract: Secondary | ICD-10-CM | POA: Diagnosis not present

## 2016-07-13 DIAGNOSIS — H25011 Cortical age-related cataract, right eye: Secondary | ICD-10-CM | POA: Diagnosis not present

## 2016-07-20 ENCOUNTER — Ambulatory Visit: Payer: BLUE CROSS/BLUE SHIELD

## 2016-08-24 DIAGNOSIS — H25812 Combined forms of age-related cataract, left eye: Secondary | ICD-10-CM | POA: Diagnosis not present

## 2016-08-24 DIAGNOSIS — H268 Other specified cataract: Secondary | ICD-10-CM | POA: Diagnosis not present

## 2016-08-24 DIAGNOSIS — H25012 Cortical age-related cataract, left eye: Secondary | ICD-10-CM | POA: Diagnosis not present

## 2016-09-08 ENCOUNTER — Encounter: Payer: Self-pay | Admitting: Internal Medicine

## 2016-09-08 ENCOUNTER — Ambulatory Visit (INDEPENDENT_AMBULATORY_CARE_PROVIDER_SITE_OTHER): Payer: BLUE CROSS/BLUE SHIELD | Admitting: Internal Medicine

## 2016-09-08 VITALS — BP 118/80 | HR 90 | Wt 146.0 lb

## 2016-09-08 DIAGNOSIS — E063 Autoimmune thyroiditis: Secondary | ICD-10-CM

## 2016-09-08 LAB — T3, FREE: T3, Free: 3.4 pg/mL (ref 2.3–4.2)

## 2016-09-08 LAB — TSH: TSH: 3.85 u[IU]/mL (ref 0.35–4.50)

## 2016-09-08 LAB — T4, FREE: Free T4: 0.76 ng/dL (ref 0.60–1.60)

## 2016-09-08 NOTE — Progress Notes (Signed)
Patient ID: SHAKEA ISIP, female   DOB: [birth year], 56 y.o.   MRN: [id number]   HPI  Mary Hughes is a 56 y.o.-year-old female, initially referred by her ObGyn Dr., Dr Phineas Real, returning for f/u for euthyroid Hashimoto's thyroiditis. Last visit 1 year ago.  Reviewed hx: Pt. has been dx with Hashimoto's thyroiditis hypothyroidism in 01/2015; she is not on Levothyroxine.  Retrospectively, she had a thyroid U/S in 2002 for enlarged thyroid >> aspect of the thyroid was heterogeneous >> thyroid inflammation has been present since then. There were no thyroid nodules.  I reviewed pt's thyroid tests: Lab Results  Component Value Date   TSH 4.43 03/16/2016   TSH 4.15 11/09/2015   TSH 4.91 (H) 09/09/2015   TSH 3.55 03/12/2015   TSH 2.564 01/21/2015   TSH 4.617 (H) 01/18/2015   TSH 3.873 01/09/2014   TSH 2.392 01/08/2013   FREET4 0.58 (L) 03/16/2016   FREET4 0.62 11/09/2015   FREET4 0.69 09/09/2015   FREET4 0.61 03/12/2015   T3FREE 3.3 03/16/2016   T3FREE 2.9 11/09/2015   T3FREE 3.1 09/09/2015   T3FREE 3.2 03/12/2015   09/09/2015: TPO antibodies >900 (on Selenium) 01/21/2015: TPO antibodies >900  She continues Selenium 200 mg daily.  Pt has no complaints today: - no weight gain - no fatigue - no dizziness/lightheadedness - no cold intolerance - no depression/no anxiety - no constipation - no dry skin - no hair loss  She continues to exercise - walking 4 miles a day, spin classes, pilates.  Stopped Probiotic.  Pt denies feeling nodules in neck, hoarseness, dysphagia/odynophagia, no SOB with lying down and with going up stairs.  She has + FH of thyroid disorders in: sister with Hashimoto's thyroiditis, + thyroid nodule in daughter.  No FH of thyroid cancer. No h/o radiation tx to head or neck. No recent use of iodine supplements.  I reviewed her chart and she also has a history of HTN (dx 2016), GERD. She had cataracts removed recently (hereditary  cataracts).  ROS: Constitutional: + see HPI Eyes: no blurry vision, no xerophthalmia ENT: no sore throat, no nodules palpated in throat, no dysphagia/odynophagia, no hoarseness Cardiovascular: no CP/SOB/palpitations/leg swelling Respiratory: no cough/SOB Gastrointestinal: no N/V/D/C Musculoskeletal: no muscle/joint aches Skin: no rashes Neurological: no tremors/numbness/tingling/dizziness  I reviewed pt's medications, allergies, PMH, social hx, family hx, and changes were documented in the history of present illness. Otherwise, unchanged from my initial visit note.  Past Medical History:  Diagnosis Date  . Hypertension   . Ovarian mass 06/2005   RIGHT OVARIAN ECHOGENIC MASS  . S/P endometrial ablation 10/07   Past Surgical History:  Procedure Laterality Date  . COLONOSCOPY  04/03/2012   Procedure: COLONOSCOPY;  Surgeon: Wonda Horner, MD;  Location: [facility];  Service: Endoscopy;  Laterality: N/A;  . ENDOMETRIAL ABLATION  04/18/2006  . HIP SURGERY  08/2004   LEFT HIP REPLACEMENT  . HYSTEROSCOPY  04/18/2006   HYST, D&C WITH NOVASURE  . JOINT REPLACEMENT     left hip  . PILONIDAL CYST DRAINAGE     Removed   Social History   Social History  . Marital Status: Married    Spouse Name: N/A  . Number of Children: 2   Occupational History  . COO at [employer]   Social History Main Topics  . Smoking status: Never Smoker   . Smokeless tobacco: Never Used  . Alcohol Use: 6.0 oz/week    Wine, beer - 5x a week, 2-3 Standard  drinks or equivalent  . Drug Use: No  . Sexual Activity: Yes    Birth Control/ Protection: Other-see comments     Comment: Vasectomy-1st intercourse 56 yo-Fewer than 5 partners   Current Outpatient Prescriptions on File Prior to Visit  Medication Sig Dispense Refill  . cholecalciferol (VITAMIN D) 1000 UNITS tablet Take 1,000 Units by mouth daily.    Marland Kitchen lisinopril (PRINIVIL,ZESTRIL) 5 MG tablet TAKE 1 TABLET BY MOUTH EVERY DAY FOR BLOOD  PRESSURE  3  . MINOCYCLINE HCL ER PO Take by mouth.    . Multiple Vitamin (MULTIVITAMIN) tablet Take 1 tablet by mouth daily. Alive over 49    . Selenium 200 MCG CAPS Take 1 capsule by mouth daily.    . Probiotic Product (PROBIOTIC ACIDOPHILUS) CAPS Take 1 capsule by mouth daily.     No current facility-administered medications on file prior to visit.    No Known Allergies Family History  Problem Relation Age of Onset  . Hypertension Mother   . Lung cancer Mother   . Heart disease Father   . Cancer Father     Throat,Lymphoma  . Diabetes Maternal Grandmother   . Heart disease Maternal Grandfather    PE: BP 118/80 (BP Location: Left Arm, Patient Position: Sitting)   Pulse 90   Wt 146 lb (66.2 kg)   SpO2 98%   BMI 23.57 kg/m  Body mass index is 23.57 kg/m. Wt Readings from Last 3 Encounters:  09/08/16 146 lb (66.2 kg)  03/08/16 143 lb (64.9 kg)  09/09/15 148 lb (67.1 kg)   Constitutional: normal weight, in NAD Eyes: PERRLA, EOMI, no exophthalmos ENT: moist mucous membranes, no thyromegaly, no cervical lymphadenopathy Cardiovascular: RRR, No MRG Respiratory: CTA B Gastrointestinal: abdomen soft, NT, ND, BS+ Musculoskeletal: no deformities, strength intact in all 4 Skin: moist, warm, no rashes Neurological: no tremor with outstretched hands, DTR normal in all 4  ASSESSMENT: 1. Hashimoto thyroiditis - Thyroid ultrasound (07/26/2000):  THE THYROID GLAND IS MILDLY HETEROGENEOUS WITHOUT DEFINITE FOCAL LESIONS OR CYSTS.   THE RIGHT LOBEOF THE THYROID MEASURES 1.1 X 1.5 X 4.5 CM   THE LEFT LOBE OF THE THYROID MEASURES 1.4 X 1.1 X 4.3 CM.   NO OTHER ABNORMALITIES ARE IDENTIFIED.  PLAN: 1. Hashimoto thyroiditis - Patient with history of euthyroid Hashimoto's thyroiditis, with very high TPO antibodies, which did not significantly decrease after starting selenium. She is not symptomatic, does not complain of weight gain, fatigue, constipation, dry skin, hair loss. She is  having a good level of energy and continues to exercise consistently. - We reviewed together her previous thyroid tests, which were normal. Her TSH is high in the normal range, however, this is not unusual at the beginning of Hashimoto's thyroiditis diagnosis. Since she is asymptomatic, no treatment is needed for now. She agrees. - We'll repeat her TFTs today and add TPO antibodies, we discussed to possibly stop selenium if these are still high - If her TSH increases, we may need to confirm the increase and then possibly start levothyroxine. We did discuss about how to take levothyroxine correctly, in case she needs to start it: every day, with water, at least 30 minutes before breakfast, separated by at least 4 hours from: - acid reflux medications - calcium - iron - multivitamins - Return in about 1 year (around 09/08/2017).  Component     Latest Ref Rng & Units 09/08/2016  TSH     0.35 - 4.50 uIU/mL 3.85  Thyroperoxidase Ab SerPl-aCnc     <  9 IU/mL >900 (H)  T4,Free(Direct)     0.60 - 1.60 ng/dL 0.76  Triiodothyronine,Free,Serum     2.3 - 4.2 pg/mL 3.4   TFTs are normal, but TPO Abs are still high >> I will advise her that she can stop Selenium. CC:  Dr Wynonia Lawman - cardiology Dr Phineas Real - ObGyn  Mary Kingdom, MD PhD [facility] Endocrinology

## 2016-09-08 NOTE — Patient Instructions (Signed)
Please stop at the lab.  Please return in 1 year.  

## 2016-09-11 LAB — THYROID PEROXIDASE ANTIBODY: Thyroperoxidase Ab SerPl-aCnc: >900 IU/mL — ABNORMAL HIGH (ref <9)

## 2016-09-22 DIAGNOSIS — H5213 Myopia, bilateral: Secondary | ICD-10-CM | POA: Diagnosis not present

## 2016-12-22 DIAGNOSIS — E063 Autoimmune thyroiditis: Secondary | ICD-10-CM | POA: Diagnosis not present

## 2016-12-22 DIAGNOSIS — Z86010 Personal history of colonic polyps: Secondary | ICD-10-CM | POA: Diagnosis not present

## 2016-12-22 DIAGNOSIS — I10 Essential (primary) hypertension: Secondary | ICD-10-CM | POA: Diagnosis not present

## 2016-12-22 DIAGNOSIS — Z13220 Encounter for screening for lipoid disorders: Secondary | ICD-10-CM | POA: Diagnosis not present

## 2016-12-27 DIAGNOSIS — Z0289 Encounter for other administrative examinations: Secondary | ICD-10-CM

## 2017-03-13 ENCOUNTER — Ambulatory Visit (INDEPENDENT_AMBULATORY_CARE_PROVIDER_SITE_OTHER): Payer: BLUE CROSS/BLUE SHIELD | Admitting: Gynecology

## 2017-03-13 ENCOUNTER — Encounter: Payer: Self-pay | Admitting: Gynecology

## 2017-03-13 VITALS — BP 120/76 | Ht 66.0 in | Wt 150.0 lb

## 2017-03-13 DIAGNOSIS — N912 Amenorrhea, unspecified: Secondary | ICD-10-CM | POA: Diagnosis not present

## 2017-03-13 DIAGNOSIS — N951 Menopausal and female climacteric states: Secondary | ICD-10-CM | POA: Diagnosis not present

## 2017-03-13 DIAGNOSIS — Z01419 Encounter for gynecological examination (general) (routine) without abnormal findings: Secondary | ICD-10-CM | POA: Diagnosis not present

## 2017-03-13 NOTE — Progress Notes (Signed)
    Mary Hughes Oct 03, 1960 [id number]        55 y.o.  [id number] for annual gynecologic exam.  Starting to have some menopausal symptoms with hot flushes and sweats throughout the day. Has been amenorrheic for a number of years due to her endometrial ablation. Has had normal FSH's in the past.  Past medical history,surgical history, problem list, medications, allergies, family history and social history were all reviewed and documented as reviewed in the EPIC chart.  ROS:  Performed with pertinent positives and negatives included in the history, assessment and plan.   Additional significant findings :  None   Exam: [name] assistant Vitals:   03/13/17 1613  BP: 120/76  Weight: 150 lb (68 kg)  Height: 5\' 6"  (1.676 m)   Body mass index is 24.21 kg/m.  General appearance:  Normal affect, orientation and appearance. Skin: Grossly normal HEENT: Without gross lesions.  No cervical or supraclavicular adenopathy. Thyroid normal.  Lungs:  Clear without wheezing, rales or rhonchi Cardiac: RR, without RMG Abdominal:  Soft, nontender, without masses, guarding, rebound, organomegaly or hernia Breasts:  Examined lying and sitting without masses, retractions, discharge or axillary adenopathy. Pelvic:  Ext, BUS, Vagina: Normal  Cervix: Normal  Uterus: Anteverted, normal size, shape and contour, midline and mobile nontender   Adnexa: Without masses or tenderness    Anus and perineum: Normal   Rectovaginal: Normal sphincter tone without palpated masses or tenderness.    Assessment/Plan:  56 y.o. [id number] female for annual gynecologic exam.   1. Amenorrhea/menopausal symptoms. We'll go ahead and check [facility] today. Discussed treatment options for menopausal symptoms to include observation, OTC products, pharmacologic nonhormonal such as Effexor and HRT. At this point the patient is not interested in prescription treatments but may try some over-the-counter products. She'll follow up if she  wants to rediscuss treatment options. She'll follow up if she does any bleeding. 2. Pap smear/HPV 2015. No Pap smear done today. No history of significant abnormal Pap smears. Plan repeat Pap smear at 5 year interval per current screening guidelines. 3. Mammography 06/2016. Continue with annual mammography when due. SBE monthly reviewed. 4. Colonoscopy 2013. Repeat at their recommended interval. 5. Echogenic right ovarian mass measured at 18 mm. Followed with serial ultrasounds over years time with no change. We both have agreed stop monitoring. 6. Health maintenance. Patient reports having routine blood work done elsewhere. Follow up in one year, sooner if any issues.   Anastasio Auerbach MD, 4:47 PM 03/13/2017

## 2017-03-13 NOTE — Patient Instructions (Signed)
Follow up in one year for annual exam.  Follow up sooner if any issues or if your menopausal symptoms worsen and you want to rediscuss treatment options.

## 2017-03-14 LAB — FOLLICLE STIMULATING HORMONE: FSH: 79.1 m[IU]/mL

## 2017-03-28 DIAGNOSIS — D225 Melanocytic nevi of trunk: Secondary | ICD-10-CM | POA: Diagnosis not present

## 2017-03-28 DIAGNOSIS — L700 Acne vulgaris: Secondary | ICD-10-CM | POA: Diagnosis not present

## 2017-03-28 DIAGNOSIS — D1801 Hemangioma of skin and subcutaneous tissue: Secondary | ICD-10-CM | POA: Diagnosis not present

## 2017-03-28 DIAGNOSIS — D2272 Melanocytic nevi of left lower limb, including hip: Secondary | ICD-10-CM | POA: Diagnosis not present

## 2017-03-29 DIAGNOSIS — Z23 Encounter for immunization: Secondary | ICD-10-CM | POA: Diagnosis not present

## 2017-04-27 DIAGNOSIS — M7672 Peroneal tendinitis, left leg: Secondary | ICD-10-CM | POA: Diagnosis not present

## 2017-04-27 DIAGNOSIS — M7741 Metatarsalgia, right foot: Secondary | ICD-10-CM | POA: Diagnosis not present

## 2017-04-27 DIAGNOSIS — M79671 Pain in right foot: Secondary | ICD-10-CM | POA: Diagnosis not present

## 2017-04-27 DIAGNOSIS — M79672 Pain in left foot: Secondary | ICD-10-CM | POA: Diagnosis not present

## 2017-05-14 DIAGNOSIS — I10 Essential (primary) hypertension: Secondary | ICD-10-CM | POA: Diagnosis not present

## 2017-05-14 DIAGNOSIS — I349 Nonrheumatic mitral valve disorder, unspecified: Secondary | ICD-10-CM | POA: Diagnosis not present

## 2017-05-14 DIAGNOSIS — I340 Nonrheumatic mitral (valve) insufficiency: Secondary | ICD-10-CM | POA: Diagnosis not present

## 2017-06-18 ENCOUNTER — Other Ambulatory Visit: Payer: Self-pay | Admitting: Gynecology

## 2017-06-18 DIAGNOSIS — Z1231 Encounter for screening mammogram for malignant neoplasm of breast: Secondary | ICD-10-CM

## 2017-06-20 DIAGNOSIS — Z86010 Personal history of colonic polyps: Secondary | ICD-10-CM | POA: Diagnosis not present

## 2017-06-20 DIAGNOSIS — K5730 Diverticulosis of large intestine without perforation or abscess without bleeding: Secondary | ICD-10-CM | POA: Diagnosis not present

## 2017-06-20 DIAGNOSIS — K5140 Inflammatory polyps of colon without complications: Secondary | ICD-10-CM | POA: Diagnosis not present

## 2017-06-26 DIAGNOSIS — K5140 Inflammatory polyps of colon without complications: Secondary | ICD-10-CM | POA: Diagnosis not present

## 2017-07-10 ENCOUNTER — Ambulatory Visit: Payer: BLUE CROSS/BLUE SHIELD

## 2017-07-30 ENCOUNTER — Ambulatory Visit
Admission: RE | Admit: 2017-07-30 | Discharge: 2017-07-30 | Disposition: A | Discharge status: Home / Self Care | Payer: BLUE CROSS/BLUE SHIELD | Source: Ambulatory Visit | Attending: Gynecology | Admitting: Gynecology

## 2017-07-30 DIAGNOSIS — Z1231 Encounter for screening mammogram for malignant neoplasm of breast: Secondary | ICD-10-CM | POA: Diagnosis not present

## 2017-07-31 ENCOUNTER — Other Ambulatory Visit: Payer: Self-pay | Admitting: Gynecology

## 2017-07-31 DIAGNOSIS — R928 Other abnormal and inconclusive findings on diagnostic imaging of breast: Secondary | ICD-10-CM

## 2017-08-01 ENCOUNTER — Ambulatory Visit: Admission: RE | Admit: 2017-08-01 | Payer: BLUE CROSS/BLUE SHIELD | Source: Ambulatory Visit

## 2017-08-01 ENCOUNTER — Ambulatory Visit
Admission: RE | Admit: 2017-08-01 | Discharge: 2017-08-01 | Disposition: A | Discharge status: Home / Self Care | Payer: BLUE CROSS/BLUE SHIELD | Source: Ambulatory Visit | Attending: Gynecology | Admitting: Gynecology

## 2017-08-01 DIAGNOSIS — R922 Inconclusive mammogram: Secondary | ICD-10-CM | POA: Diagnosis not present

## 2017-08-01 DIAGNOSIS — R928 Other abnormal and inconclusive findings on diagnostic imaging of breast: Secondary | ICD-10-CM

## 2017-09-07 ENCOUNTER — Encounter: Payer: Self-pay | Admitting: Internal Medicine

## 2017-09-07 ENCOUNTER — Ambulatory Visit: Payer: BLUE CROSS/BLUE SHIELD | Admitting: Internal Medicine

## 2017-09-07 VITALS — BP 124/74 | HR 94 | Ht 66.0 in | Wt 151.8 lb

## 2017-09-07 DIAGNOSIS — E063 Autoimmune thyroiditis: Secondary | ICD-10-CM | POA: Diagnosis not present

## 2017-09-07 LAB — T3, FREE: T3, Free: 3.2 pg/mL (ref 2.3–4.2)

## 2017-09-07 LAB — TSH: TSH: 4.7 u[IU]/mL — ABNORMAL HIGH (ref 0.35–4.50)

## 2017-09-07 LAB — T4, FREE: Free T4: 0.53 ng/dL — ABNORMAL LOW (ref 0.60–1.60)

## 2017-09-07 NOTE — Progress Notes (Signed)
Patient ID: Mary Hughes, female   DOB: 04/27/1961, 57 y.o.   MRN: [id number]   HPI  Mary Hughes is a 57 y.o.-year-old female, initially referred by her ObGyn Dr., Dr Phineas Real, returning for f/u for euthyroid Hashimoto's thyroiditis. Last visit 1 year ago.  Reviewed and addended history: Pt. has been dx with Hashimoto's thyroiditis in 01/2015 ; she continues off levothyroxine.  Retrospectively, she had a thyroid U/S in 2002 for enlarged thyroid >> aspect of the thyroid was heterogeneous >> thyroid inflammation has been present since then.  No thyroid nodules.  Reviewed her TFTs: Lab Results  Component Value Date   TSH 3.85 09/08/2016   TSH 4.43 03/16/2016   TSH 4.15 11/09/2015   TSH 4.91 (H) 09/09/2015   TSH 3.55 03/12/2015   TSH 2.564 01/21/2015   TSH 4.617 (H) 01/18/2015   TSH 3.873 01/09/2014   TSH 2.392 01/08/2013   FREET4 0.76 09/08/2016   FREET4 0.58 (L) 03/16/2016   FREET4 0.62 11/09/2015   FREET4 0.69 09/09/2015   FREET4 0.61 03/12/2015   T3FREE 3.4 09/08/2016   T3FREE 3.3 03/16/2016   T3FREE 2.9 11/09/2015   T3FREE 3.1 09/09/2015   T3FREE 3.2 03/12/2015   09/09/2015: TPO antibodies >900 (on Selenium) 01/21/2015: TPO antibodies >900  We stopped Selenium 200 mg daily at last visit, since her antibodies remained high.  She continues to exercise: Walking, Pilates, but not as much as before.  Pt denies: - feeling nodules in neck - hoarseness - dysphagia - choking - SOB with lying down  She has + FH of thyroid disorders in: sister with Hashimoto's thyroiditis, + thyroid nodule in daughter.  No FH of thyroid cancer. No h/o radiation tx to head or neck.  No seaweed or kelp. No recent contrast studies. No herbal supplements. No Biotin use. No recent steroids use.   She also has HTN (dx 2016), GERD. She had cataracts removed  (hereditary cataracts).  ROS: Constitutional: + weight gain (5 lbs since last visit)/no weight loss, no fatigue, no subjective  hyperthermia, no subjective hypothermia Eyes: no blurry vision, no xerophthalmia ENT: no sore throat,  + see HPI Cardiovascular: no CP/no SOB/no palpitations/no leg swelling Respiratory: no cough/no SOB/no wheezing Gastrointestinal: no N/no V/no D/no C/no acid reflux Musculoskeletal: no muscle aches/no joint aches Skin: no rashes, no hair loss Neurological: no tremors/no numbness/no tingling/no dizziness  I reviewed pt's medications, allergies, PMH, social hx, family hx, and changes were documented in the history of present illness. Otherwise, unchanged from my initial visit note.  Past Medical History:  Diagnosis Date  . Hypertension   . Ovarian mass 06/2005   RIGHT OVARIAN ECHOGENIC MASS  . S/P endometrial ablation 10/07   Past Surgical History:  Procedure Laterality Date  . COLONOSCOPY  04/03/2012   Procedure: COLONOSCOPY;  Surgeon: Wonda Horner, MD;  Location: [facility];  Service: Endoscopy;  Laterality: N/A;  . ENDOMETRIAL ABLATION  04/18/2006  . HIP SURGERY  08/2004   LEFT HIP REPLACEMENT  . HYSTEROSCOPY  04/18/2006   HYST, D&C WITH NOVASURE  . JOINT REPLACEMENT     left hip  . PILONIDAL CYST DRAINAGE     Removed   Social History   Social History  . Marital Status: Married    Spouse Name: N/A  . Number of Children: 2   Occupational History  . COO at [employer]   Social History Main Topics  . Smoking status: Never Smoker   . Smokeless tobacco: Never Used  .  Alcohol Use: 6.0 oz/week    Wine, beer - 5x a week, 2-3 Standard drinks or equivalent  . Drug Use: No  . Sexual Activity: Yes    Birth Control/ Protection: Other-see comments     Comment: Vasectomy-1st intercourse 57 yo-Fewer than 5 partners   Current Outpatient Medications on File Prior to Visit  Medication Sig Dispense Refill  . cholecalciferol (VITAMIN D) 1000 UNITS tablet Take 1,000 Units by mouth daily.    Marland Kitchen lisinopril (PRINIVIL,ZESTRIL) 5 MG tablet TAKE 1 TABLET BY MOUTH EVERY DAY FOR  BLOOD PRESSURE  3  . MINOCYCLINE HCL ER PO Take by mouth.    . Multiple Vitamin (MULTIVITAMIN) tablet Take 1 tablet by mouth daily. Alive over 50     No current facility-administered medications on file prior to visit.    No Known Allergies Family History  Problem Relation Age of Onset  . Hypertension Mother   . Lung cancer Mother   . Heart disease Father   . Cancer Father        Throat,Lymphoma  . Diabetes Maternal Grandmother   . Heart disease Maternal Grandfather    PE: BP 124/74   Pulse 94   Ht 5\' 6"  (1.676 m)   Wt 151 lb 12.8 oz (68.9 kg)   SpO2 97%   BMI 24.50 kg/m  Body mass index is 24.5 kg/m. Wt Readings from Last 3 Encounters:  09/07/17 151 lb 12.8 oz (68.9 kg)  03/13/17 150 lb (68 kg)  09/08/16 146 lb (66.2 kg)   Constitutional: Normal weight, in NAD Eyes: PERRLA, EOMI, no exophthalmos ENT: moist mucous membranes, no thyromegaly, no cervical lymphadenopathy Cardiovascular: no Tachycardia at time of exam, RRR, No MRG Respiratory: CTA B Gastrointestinal: abdomen soft, NT, ND, BS+ Musculoskeletal: no deformities, strength intact in all 4 Skin: moist, warm, no rashes Neurological: no tremor with outstretched hands, DTR normal in all 4  ASSESSMENT: 1. Hashimoto thyroiditis - Thyroid ultrasound (07/26/2000):  THE THYROID GLAND IS MILDLY HETEROGENEOUS WITHOUT DEFINITE FOCAL LESIONS OR CYSTS.   THE RIGHT LOBEOF THE THYROID MEASURES 1.1 X 1.5 X 4.5 CM   THE LEFT LOBE OF THE THYROID MEASURES 1.4 X 1.1 X 4.3 CM.   NO OTHER ABNORMALITIES ARE IDENTIFIED.  PLAN: 1. Hashimoto thyroiditis Patient with history of euthyroid Hashimoto's thyroiditis, with very high TPO antibodies, which did not significantly decreased after starting selenium.  We stopped selenium at last visit due to lack of effect.  She is not symptomatic, has minimal weight gain since last visit, no fatigue, constipation, dry skin, hair loss.  She has a good energy level and continues to exercise  consistently, but maybe not as much cardio as before. She plans to increase this. - We reviewed together her previous TFTs which were normal.  TSH is high in the normal range, but not enough to mandate therapy.  Also, since she is not symptomatic, we do not need to start levothyroxine quite yet.  She agrees. - At this visit, we will repeat her TFTs-and her TPO antibodies. -If her TSH increases, we may need to confirm the increase by checking another set of TFTs in 1.5-2 months and then possibly start levothyroxine.  We did discuss about how to take levothyroxine correctly, in case we need to start it: Every day, with water, at least 30 minutes before breakfast, separated by at least 4 hours from acid reflux medications, calcium, iron, multivitamins. We discussed to move MVI at night if we need to start LT4. -If labs  are normal, I will have her return in 1 year for another recheck  - time spent with the patient: 15 min, of which >50% was spent in obtaining information about her symptoms, reviewing her previous labs, evaluations, and treatments, counseling her about her condition (please see the discussed topics above), and developing a plan to further investigate and treat it.  Component     Latest Ref Rng & Units 09/07/2017  TSH     0.35 - 4.50 uIU/mL 4.70 (H)  T4,Free(Direct)     0.60 - 1.60 ng/dL 0.53 (L)  Triiodothyronine,Free,Serum     2.3 - 4.2 pg/mL 3.2   TFTs are mildly abnormal.  I will discuss with her to repeat them in 1.5 months and if they are still abnormal, start low-dose levothyroxine then.  Antibody level is still pending. Philemon Kingdom, MD PhD [facility] Endocrinology

## 2017-09-07 NOTE — Patient Instructions (Signed)
Please stop at the lab.  Please return in 1 year.  

## 2017-09-10 LAB — THYROID PEROXIDASE ANTIBODY

## 2017-10-17 ENCOUNTER — Encounter: Payer: Self-pay | Admitting: Gynecology

## 2017-10-17 NOTE — Telephone Encounter (Signed)
Recommend office visit to discuss treatment options

## 2017-10-22 ENCOUNTER — Other Ambulatory Visit (INDEPENDENT_AMBULATORY_CARE_PROVIDER_SITE_OTHER): Payer: BLUE CROSS/BLUE SHIELD

## 2017-10-22 DIAGNOSIS — E063 Autoimmune thyroiditis: Secondary | ICD-10-CM | POA: Diagnosis not present

## 2017-10-22 LAB — TSH: TSH: 3.98 u[IU]/mL (ref 0.35–4.50)

## 2017-10-22 LAB — T4, FREE: FREE T4: 0.53 ng/dL — AB (ref 0.60–1.60)

## 2017-10-23 ENCOUNTER — Other Ambulatory Visit: Payer: Self-pay | Admitting: Internal Medicine

## 2017-10-23 ENCOUNTER — Encounter: Payer: Self-pay | Admitting: Internal Medicine

## 2017-10-23 DIAGNOSIS — E063 Autoimmune thyroiditis: Secondary | ICD-10-CM

## 2017-10-25 ENCOUNTER — Encounter: Payer: Self-pay | Admitting: Gynecology

## 2017-10-25 ENCOUNTER — Ambulatory Visit: Payer: BLUE CROSS/BLUE SHIELD | Admitting: Gynecology

## 2017-10-25 VITALS — BP 124/84

## 2017-10-25 DIAGNOSIS — N951 Menopausal and female climacteric states: Secondary | ICD-10-CM

## 2017-10-25 MED ORDER — ESTRADIOL 0.5 MG PO TABS: 0.5000 mg | ORAL_TABLET | Freq: Every day | ORAL | 5 refills | Status: DC

## 2017-10-25 MED ORDER — PROGESTERONE MICRONIZED 100 MG PO CAPS: 100.0000 mg | ORAL_CAPSULE | Freq: Every day | ORAL | 5 refills | Status: DC

## 2017-10-25 NOTE — Progress Notes (Signed)
    Mary Hughes 11/04/1960 [id number]        57 y.o.  [id number] presents to discuss menopausal symptoms.  Patient notes worsening hot flushes and sweats.  No significant mood swings.  No bleeding.  Has been trying to deal up with this but is coming more difficult.  Sister is on HRT and doing well with this and she wants to discuss her options.  Past medical history,surgical history, problem list, medications, allergies, family history and social history were all reviewed and documented in the EPIC chart.  Directed ROS with pertinent positives and negatives documented in the history of present illness/assessment and plan.  Exam: Vitals:   10/25/17 1207  BP: 124/84   General appearance:  Normal   Assessment/Plan:  57 y.o. [id number] with classic menopausal symptoms.  Had [facility] last year which was 42.  We discussed the perimenopause and the various symptoms and issues.  Options for management were reviewed to include OTC products, pharmacologic nonhormonal such as Effexor and HRT.  The issues of HRT, risks versus benefits were reviewed.  Several studies involving HRT were discussed to include the WHI study.  Risks to include thrombosis such as stroke heart attack DVT in the breast cancer issue versus benefits to include symptom relief as well as cardiovascular and bone health when started early were reviewed.  The patient wants to go ahead and start on hormone replacement therapy.  Various routes of administration were reviewed and the transdermal/vaginal absorption benefits from a first-pass effect was discussed.  Patient would prefer the convenience of oral and will go ahead and start her on estradiol 0.5 mg and Prometrium 100 mg daily.  She is going to plan on taking these at bedtime.  Refill x6 months provided.  She knows to call if she has any bleeding at all.  She is due for her annual this coming fall and will follow-up for this.  She will call after a month or so if she is still having  significant symptoms and will increase her estradiol dose.  She will call if she has any issues or questions.    Anastasio Auerbach MD, 12:53 PM 10/25/2017

## 2017-10-25 NOTE — Patient Instructions (Signed)
Start on hormone replacement therapy as we discussed.  Call if you have any issues with this.

## 2017-10-29 DIAGNOSIS — H524 Presbyopia: Secondary | ICD-10-CM | POA: Diagnosis not present

## 2017-10-29 DIAGNOSIS — Z961 Presence of intraocular lens: Secondary | ICD-10-CM | POA: Diagnosis not present

## 2017-11-16 ENCOUNTER — Other Ambulatory Visit: Payer: Self-pay | Admitting: Gynecology

## 2018-01-17 DIAGNOSIS — E063 Autoimmune thyroiditis: Secondary | ICD-10-CM | POA: Diagnosis not present

## 2018-01-17 DIAGNOSIS — I10 Essential (primary) hypertension: Secondary | ICD-10-CM | POA: Diagnosis not present

## 2018-01-17 DIAGNOSIS — Z86010 Personal history of colonic polyps: Secondary | ICD-10-CM | POA: Diagnosis not present

## 2018-03-14 ENCOUNTER — Ambulatory Visit (INDEPENDENT_AMBULATORY_CARE_PROVIDER_SITE_OTHER): Payer: BLUE CROSS/BLUE SHIELD | Admitting: Gynecology

## 2018-03-14 ENCOUNTER — Encounter: Payer: Self-pay | Admitting: Gynecology

## 2018-03-14 VITALS — BP 134/84 | Ht 66.0 in | Wt 147.0 lb

## 2018-03-14 DIAGNOSIS — Z124 Encounter for screening for malignant neoplasm of cervix: Secondary | ICD-10-CM | POA: Diagnosis not present

## 2018-03-14 DIAGNOSIS — Z01419 Encounter for gynecological examination (general) (routine) without abnormal findings: Secondary | ICD-10-CM

## 2018-03-14 DIAGNOSIS — N951 Menopausal and female climacteric states: Secondary | ICD-10-CM | POA: Diagnosis not present

## 2018-03-14 DIAGNOSIS — Z1151 Encounter for screening for human papillomavirus (HPV): Secondary | ICD-10-CM | POA: Diagnosis not present

## 2018-03-14 NOTE — Progress Notes (Signed)
    Mary Hughes Oct 19, 1960 [id number]        57 y.o.  [id number] for annual gynecologic exam.  Recently tried HRT but did not like the way it made her feel.  Past medical history,surgical history, problem list, medications, allergies, family history and social history were all reviewed and documented as reviewed in the EPIC chart.  ROS:  Performed with pertinent positives and negatives included in the history, assessment and plan.   Additional significant findings : None   Exam: [name] assistant Vitals:   03/14/18 1611  BP: 134/84  Weight: 147 lb (66.7 kg)  Height: 5\' 6"  (1.676 m)   Body mass index is 23.73 kg/m.  General appearance:  Normal affect, orientation and appearance. Skin: Grossly normal HEENT: Without gross lesions.  No cervical or supraclavicular adenopathy. Thyroid normal.  Lungs:  Clear without wheezing, rales or rhonchi Cardiac: RR, without RMG Abdominal:  Soft, nontender, without masses, guarding, rebound, organomegaly or hernia Breasts:  Examined lying and sitting without masses, retractions, discharge or axillary adenopathy. Pelvic:  Ext, BUS, Vagina: Normal with mild atrophic changes  Cervix: Normal.  Pap smear/HPV  Uterus: Anteverted, normal size, shape and contour, midline and mobile nontender   Adnexa: Without masses or tenderness    Anus and perineum: Normal   Rectovaginal: Normal sphincter tone without palpated masses or tenderness.    Assessment/Plan:  57 y.o. [id number] female for annual gynecologic exam.   1. Postmenopausal/menopausal symptoms.  Patient started estradiol 0.5 mg and Prometrium 100 mg in May for menopausal symptoms of hot flushes and sweats.  She did not like the way it made her feel and discontinued this.  We discussed alternative regimens if she wanted to try these but at this point she is not interested.  She prefers just to monitor her symptoms as they are tolerable.  No bleeding.  Patient will follow-up if she wants to  rediscuss HRT or any bleeding. 2. Mammography 07/2017.  Continue with annual mammography when due.  Breast exam normal today. 3. Pap smear/HPV 2015.  Pap smear/HPV today.  No history of significant abnormal Pap smears.  Will continue with every 5-year Pap smear/HPV screening per current screening guidelines. 4. Colonoscopy 2018.  Repeat at their recommended interval. 5. DEXA never.  Will plan further into the menopause. 6. Health maintenance.  No routine lab work done as patient reports this done elsewhere.  Follow-up 1 year, sooner as needed.   Anastasio Auerbach MD, 4:47 PM 03/14/2018

## 2018-03-14 NOTE — Patient Instructions (Signed)
Follow-up in 1 year, sooner as needed. 

## 2018-03-15 LAB — PAP IG AND HPV HIGH-RISK: HPV DNA HIGH RISK: NOT DETECTED

## 2018-05-01 DIAGNOSIS — L570 Actinic keratosis: Secondary | ICD-10-CM | POA: Diagnosis not present

## 2018-05-01 DIAGNOSIS — D485 Neoplasm of uncertain behavior of skin: Secondary | ICD-10-CM | POA: Diagnosis not present

## 2018-05-01 DIAGNOSIS — D1801 Hemangioma of skin and subcutaneous tissue: Secondary | ICD-10-CM | POA: Diagnosis not present

## 2018-05-01 DIAGNOSIS — D2272 Melanocytic nevi of left lower limb, including hip: Secondary | ICD-10-CM | POA: Diagnosis not present

## 2018-05-01 DIAGNOSIS — L814 Other melanin hyperpigmentation: Secondary | ICD-10-CM | POA: Diagnosis not present

## 2018-05-01 DIAGNOSIS — L918 Other hypertrophic disorders of the skin: Secondary | ICD-10-CM | POA: Diagnosis not present

## 2018-07-05 ENCOUNTER — Other Ambulatory Visit: Payer: Self-pay | Admitting: Gynecology

## 2018-07-05 DIAGNOSIS — Z1231 Encounter for screening mammogram for malignant neoplasm of breast: Secondary | ICD-10-CM

## 2018-08-02 ENCOUNTER — Ambulatory Visit
Admission: RE | Admit: 2018-08-02 | Discharge: 2018-08-02 | Disposition: A | Discharge status: Home / Self Care | Payer: BLUE CROSS/BLUE SHIELD | Source: Ambulatory Visit

## 2018-08-02 DIAGNOSIS — Z1231 Encounter for screening mammogram for malignant neoplasm of breast: Secondary | ICD-10-CM

## 2018-09-09 ENCOUNTER — Other Ambulatory Visit: Payer: Self-pay

## 2018-09-09 ENCOUNTER — Ambulatory Visit: Payer: BLUE CROSS/BLUE SHIELD | Admitting: Internal Medicine

## 2018-09-09 ENCOUNTER — Encounter: Payer: Self-pay | Admitting: Internal Medicine

## 2018-09-09 VITALS — BP 150/100 | HR 86 | Temp 98.4°F | Ht 66.0 in | Wt 149.0 lb

## 2018-09-09 DIAGNOSIS — E038 Other specified hypothyroidism: Secondary | ICD-10-CM | POA: Diagnosis not present

## 2018-09-09 DIAGNOSIS — E063 Autoimmune thyroiditis: Secondary | ICD-10-CM | POA: Diagnosis not present

## 2018-09-09 LAB — T3, FREE: T3, Free: 3.3 pg/mL (ref 2.3–4.2)

## 2018-09-09 LAB — T4, FREE: Free T4: 0.53 ng/dL — ABNORMAL LOW (ref 0.60–1.60)

## 2018-09-09 LAB — TSH: TSH: 6.2 u[IU]/mL — ABNORMAL HIGH (ref 0.35–4.50)

## 2018-09-09 NOTE — Progress Notes (Signed)
Patient ID: Mary Hughes, female   DOB: 1961/05/29, 58 y.o.   MRN: [id number]   HPI  Mary Hughes is a 58 y.o.-year-old female, initially referred by her ObGyn Dr., Dr Phineas Real, returning for f/u for euthyroid Hashimoto's thyroiditis. Last visit 1 year ago.  Reviewed and addended history: Pt. has been dx with Hashimoto's thyroiditis in 01/2015 ; she continues on levothyroxine.  Retrospectively, she had a thyroid U/S in 2002 for enlarged thyroid >> aspect of the thyroid was heterogeneous >> thyroid inflammation has been present since then.  No thyroid nodules.  Reviewed her TFTs: Lab Results  Component Value Date   TSH 3.98 10/22/2017   TSH 4.70 (H) 09/07/2017   TSH 3.85 09/08/2016   TSH 4.43 03/16/2016   TSH 4.15 11/09/2015   TSH 4.91 (H) 09/09/2015   TSH 3.55 03/12/2015   TSH 2.564 01/21/2015   TSH 4.617 (H) 01/18/2015   TSH 3.873 01/09/2014   FREET4 0.53 (L) 10/22/2017   FREET4 0.53 (L) 09/07/2017   FREET4 0.76 09/08/2016   FREET4 0.58 (L) 03/16/2016   FREET4 0.62 11/09/2015   FREET4 0.69 09/09/2015   FREET4 0.61 03/12/2015   T3FREE 3.2 09/07/2017   T3FREE 3.4 09/08/2016   T3FREE 3.3 03/16/2016   T3FREE 2.9 11/09/2015   T3FREE 3.1 09/09/2015   T3FREE 3.2 03/12/2015   Component     Latest Ref Rng & Units 01/21/2015 09/09/2015 (on selenium) 09/08/2016 09/07/2017  Thyroperoxidase Ab SerPl-aCnc     <9 IU/mL >900 (H) >900 (H) >900 (H) >900 (H)   We stopped selenium 200 mg daily 08/2016 since her antibodies remain high.  She continues to exercise: Walking, Pilates.  Pt denies: - feeling nodules in neck - hoarseness - dysphagia - choking - SOB with lying down  She has + FH of thyroid disorders in: sister with Hashimoto's thyroiditis, + thyroid nodule in daughter.  No FH of thyroid cancer. No h/o radiation tx to head or neck. No seaweed or kelp. No recent contrast studies. No herbal supplements. No Biotin use. No recent steroids use.    She also has HTN (dx 2016),  GERD. She had cataracts removed  (hereditary cataracts).  ROS: Constitutional: no weight gain/no weight loss, no fatigue, no subjective hyperthermia, no subjective hypothermia Eyes: no blurry vision, no xerophthalmia ENT: no sore throat, + see HPI Cardiovascular: no CP/no SOB/no palpitations/no leg swelling Respiratory: no cough/no SOB/no wheezing Gastrointestinal: no N/no V/no D/no C/no acid reflux Musculoskeletal: no muscle aches/no joint aches Skin: no rashes, no hair loss Neurological: no tremors/no numbness/no tingling/no dizziness  I reviewed pt's medications, allergies, PMH, social hx, family hx, and changes were documented in the history of present illness. Otherwise, unchanged from my initial visit note.  Past Medical History:  Diagnosis Date  . Hypertension   . Ovarian mass 06/2005   RIGHT OVARIAN ECHOGENIC MASS  . S/P endometrial ablation 10/07   Past Surgical History:  Procedure Laterality Date  . COLONOSCOPY  04/03/2012   Procedure: COLONOSCOPY;  Surgeon: Wonda Horner, MD;  Location: [facility];  Service: Endoscopy;  Laterality: N/A;  . ENDOMETRIAL ABLATION  04/18/2006  . HIP SURGERY  08/2004   LEFT HIP REPLACEMENT  . HYSTEROSCOPY  04/18/2006   HYST, D&C WITH NOVASURE  . JOINT REPLACEMENT     left hip  . PILONIDAL CYST DRAINAGE     Removed   Social History   Social History  . Marital Status: Married    Spouse Name: N/A  . Number  of Children: 2   Occupational History  . COO at [employer]   Social History Main Topics  . Smoking status: Never Smoker   . Smokeless tobacco: Never Used  . Alcohol Use: 6.0 oz/week    Wine, beer - 5x a week, 2-3 Standard drinks or equivalent  . Drug Use: No  . Sexual Activity: Yes    Birth Control/ Protection: Other-see comments     Comment: Vasectomy-1st intercourse 58 yo-Fewer than 5 partners   Current Outpatient Medications on File Prior to Visit  Medication Sig Dispense Refill  . cholecalciferol (VITAMIN  D) 1000 UNITS tablet Take 1,000 Units by mouth daily.    Marland Kitchen lisinopril (PRINIVIL,ZESTRIL) 5 MG tablet 10 mg.   3  . MINOCYCLINE HCL ER PO Take by mouth.    . Multiple Vitamin (MULTIVITAMIN) tablet Take 1 tablet by mouth daily. Alive over 50     No current facility-administered medications on file prior to visit.    No Known Allergies Family History  Problem Relation Age of Onset  . Hypertension Mother   . Lung cancer Mother   . Heart disease Father   . Cancer Father        Throat,Lymphoma  . Diabetes Maternal Grandmother   . Heart disease Maternal Grandfather    PE: BP (!) 150/100   Pulse 86   Temp 98.4 F (36.9 C) (Oral)   Ht 5\' 6"  (1.676 m) Comment: measured  Wt 149 lb (67.6 kg)   SpO2 98%   BMI 24.05 kg/m  Body mass index is 24.05 kg/m. Wt Readings from Last 3 Encounters:  09/09/18 149 lb (67.6 kg)  03/14/18 147 lb (66.7 kg)  09/07/17 151 lb 12.8 oz (68.9 kg)   Constitutional: Normal weight, in NAD Eyes: PERRLA, EOMI, no exophthalmos ENT: moist mucous membranes, no thyromegaly, no cervical lymphadenopathy Cardiovascular: RRR, No MRG Respiratory: CTA B Gastrointestinal: abdomen soft, NT, ND, BS+ Musculoskeletal: no deformities, strength intact in all 4 Skin: moist, warm, no rashes Neurological: no tremor with outstretched hands, DTR normal in all 4  ASSESSMENT: 1. Hashimoto thyroiditis - Thyroid ultrasound (07/26/2000):  THE THYROID GLAND IS MILDLY HETEROGENEOUS WITHOUT DEFINITE FOCAL LESIONS OR CYSTS.   THE RIGHT LOBEOF THE THYROID MEASURES 1.1 X 1.5 X 4.5 CM   THE LEFT LOBE OF THE THYROID MEASURES 1.4 X 1.1 X 4.3 CM.   NO OTHER ABNORMALITIES ARE IDENTIFIED.  PLAN: 1. Hashimoto thyroiditis -Patient with history of euthyroid Hashimoto's thyroiditis, with very high TPO antibodies, which did not significantly decrease after starting selenium.  We stop selenium due to lack of effect (thyroid Ab's remained undetectably high) almost 2 years ago.  She is not  symptomatic, and appears euthyroid.  She denies weight gain, fatigue, constipation, dry skin, hair loss.  She continues to exercise consistently and reports good energy. -We reviewed together her previous TFTs.  TSH was slightly increased 08/2017 but we did not start levothyroxine then as we wanted to repeat the test 2 months later.  In 10/2017 TSH normalized, while free T4 was slightly low.  We did not start her on therapy. -We again discussed about the possible need to start levothyroxine and how to take it correctly: Every day, with water, at least 30 minutes before breakfast, separated by at least 4 hours from acid reflux medications, calcium, iron, multivitamins.  We discussed about moving multivitamins at night in that case. -At this visit, we will recheck her TFTs and also her antibodies.   -If TFTs  are abnormal we may need to repeat them in 1.5 months.  If they are normal, I will see her back in a year.  - time spent with the patient: 15 min, of which >50% was spent in obtaining information about her symptoms, reviewing her previous labs, evaluations, and treatments, counseling her about her condition (please see the discussed topics above), and developing a plan to further investigate and treat it.  Component     Latest Ref Rng & Units 09/09/2018  TSH     0.35 - 4.50 uIU/mL 6.20 (H)  Thyroperoxidase Ab SerPl-aCnc     <9 IU/mL >900 (H)  T4,Free(Direct)     0.60 - 1.60 ng/dL 0.53 (L)  Triiodothyronine,Free,Serum     2.3 - 4.2 pg/mL 3.3   Thyroid antibodies remain undetectably high.  Her TSH higher than normal and free T4 is low.  At this point, I would suggest a low dose of levothyroxine, 25 mcg daily and will recheck her test in 1.5 months.  Philemon Kingdom, MD PhD [facility] Endocrinology

## 2018-09-09 NOTE — Patient Instructions (Signed)
Please stop at the lab.  Please come back for a follow-up appointment in 1 year.  

## 2018-09-10 LAB — THYROID PEROXIDASE ANTIBODY

## 2018-09-10 MED ORDER — LEVOTHYROXINE SODIUM 25 MCG PO TABS: 25.0000 ug | ORAL_TABLET | Freq: Every day | ORAL | 5 refills | Status: DC

## 2018-10-30 ENCOUNTER — Other Ambulatory Visit: Payer: Self-pay | Admitting: Internal Medicine

## 2018-10-30 ENCOUNTER — Other Ambulatory Visit: Payer: Self-pay

## 2018-10-30 ENCOUNTER — Other Ambulatory Visit (INDEPENDENT_AMBULATORY_CARE_PROVIDER_SITE_OTHER): Payer: BLUE CROSS/BLUE SHIELD

## 2018-10-30 ENCOUNTER — Encounter: Payer: Self-pay | Admitting: Internal Medicine

## 2018-10-30 DIAGNOSIS — E063 Autoimmune thyroiditis: Secondary | ICD-10-CM | POA: Diagnosis not present

## 2018-10-30 DIAGNOSIS — E038 Other specified hypothyroidism: Secondary | ICD-10-CM

## 2018-10-30 LAB — TSH: TSH: 9.2 u[IU]/mL — ABNORMAL HIGH (ref 0.35–4.50)

## 2018-10-30 LAB — T4, FREE: Free T4: 0.8 ng/dL (ref 0.60–1.60)

## 2018-11-05 DIAGNOSIS — H40013 Open angle with borderline findings, low risk, bilateral: Secondary | ICD-10-CM | POA: Diagnosis not present

## 2018-11-07 ENCOUNTER — Other Ambulatory Visit: Payer: Self-pay | Admitting: Internal Medicine

## 2018-11-07 MED ORDER — LEVOTHYROXINE SODIUM 50 MCG PO TABS: 50.0000 ug | ORAL_TABLET | Freq: Every day | ORAL | 30 refills | Status: DC

## 2018-12-09 DIAGNOSIS — Z20828 Contact with and (suspected) exposure to other viral communicable diseases: Secondary | ICD-10-CM | POA: Diagnosis not present

## 2018-12-12 ENCOUNTER — Other Ambulatory Visit: Payer: BLUE CROSS/BLUE SHIELD

## 2018-12-13 ENCOUNTER — Other Ambulatory Visit (INDEPENDENT_AMBULATORY_CARE_PROVIDER_SITE_OTHER): Payer: BC Managed Care – PPO

## 2018-12-13 ENCOUNTER — Other Ambulatory Visit: Payer: Self-pay

## 2018-12-13 DIAGNOSIS — E038 Other specified hypothyroidism: Secondary | ICD-10-CM | POA: Diagnosis not present

## 2018-12-13 DIAGNOSIS — E063 Autoimmune thyroiditis: Secondary | ICD-10-CM | POA: Diagnosis not present

## 2018-12-13 LAB — TSH: TSH: 2.27 u[IU]/mL (ref 0.35–4.50)

## 2018-12-13 LAB — T4, FREE: Free T4: 0.69 ng/dL (ref 0.60–1.60)

## 2018-12-23 ENCOUNTER — Telehealth: Payer: Self-pay

## 2018-12-23 DIAGNOSIS — Z20822 Contact with and (suspected) exposure to covid-19: Secondary | ICD-10-CM

## 2018-12-23 NOTE — Telephone Encounter (Signed)
rec'd referral from Dr. Andrew Au office for COVID testing, due to poss. Exposure.  Phone call to pt.  Scheduled appt. For COVID testing 12/24/18 @ 1:45 PM at [place] site.  Advised to wear a mask and remain in car for testing.  Verb. Understanding.

## 2018-12-24 ENCOUNTER — Other Ambulatory Visit: Payer: BC Managed Care – PPO

## 2018-12-24 DIAGNOSIS — Z20822 Contact with and (suspected) exposure to covid-19: Secondary | ICD-10-CM

## 2018-12-24 DIAGNOSIS — R6889 Other general symptoms and signs: Secondary | ICD-10-CM | POA: Diagnosis not present

## 2018-12-25 ENCOUNTER — Encounter: Payer: Self-pay | Admitting: Internal Medicine

## 2018-12-25 ENCOUNTER — Other Ambulatory Visit: Payer: BC Managed Care – PPO

## 2018-12-25 ENCOUNTER — Other Ambulatory Visit: Payer: Self-pay | Admitting: Internal Medicine

## 2018-12-25 MED ORDER — TIROSINT 50 MCG PO CAPS: ORAL_CAPSULE | ORAL | 3 refills | Status: DC

## 2018-12-29 LAB — NOVEL CORONAVIRUS, NAA: SARS-CoV-2, NAA: NOT DETECTED

## 2019-01-20 DIAGNOSIS — Z86010 Personal history of colonic polyps: Secondary | ICD-10-CM | POA: Diagnosis not present

## 2019-01-20 DIAGNOSIS — I10 Essential (primary) hypertension: Secondary | ICD-10-CM | POA: Diagnosis not present

## 2019-01-20 DIAGNOSIS — E063 Autoimmune thyroiditis: Secondary | ICD-10-CM | POA: Diagnosis not present

## 2019-01-23 DIAGNOSIS — I10 Essential (primary) hypertension: Secondary | ICD-10-CM | POA: Diagnosis not present

## 2019-03-14 ENCOUNTER — Other Ambulatory Visit: Payer: Self-pay

## 2019-03-17 ENCOUNTER — Encounter: Payer: Self-pay | Admitting: Gynecology

## 2019-03-17 ENCOUNTER — Ambulatory Visit (INDEPENDENT_AMBULATORY_CARE_PROVIDER_SITE_OTHER): Payer: BC Managed Care – PPO | Admitting: Gynecology

## 2019-03-17 ENCOUNTER — Other Ambulatory Visit: Payer: Self-pay

## 2019-03-17 VITALS — BP 124/80 | Ht 66.0 in | Wt 150.0 lb

## 2019-03-17 DIAGNOSIS — Z01419 Encounter for gynecological examination (general) (routine) without abnormal findings: Secondary | ICD-10-CM

## 2019-03-17 DIAGNOSIS — R102 Pelvic and perineal pain: Secondary | ICD-10-CM | POA: Diagnosis not present

## 2019-03-17 DIAGNOSIS — N838 Other noninflammatory disorders of ovary, fallopian tube and broad ligament: Secondary | ICD-10-CM

## 2019-03-17 NOTE — Patient Instructions (Signed)
Follow-up for the ultrasound as scheduled. 

## 2019-03-17 NOTE — Progress Notes (Signed)
    Mary Hughes [birth year] [id number]        58 y.o.  [id number] for annual gynecologic exam.  Having some suprapubic intermittent discomfort that the patient feels is due to exercising.  No urinary symptoms such as frequency dysuria urgency.  No GI symptoms such as constipation diarrhea.  She does have a history of echogenic right ovarian mass followed for years stable at approximately 18 mm.  Last ultrasound was 2015.  Past medical history,surgical history, problem list, medications, allergies, family history and social history were all reviewed and documented as reviewed in the EPIC chart.  ROS:  Performed with pertinent positives and negatives included in the history, assessment and plan.   Additional significant findings : None   Exam: [name] assistant Vitals:   03/17/19 1556  BP: 124/80  Weight: 150 lb (68 kg)  Height: 5\' 6"  (1.676 m)   Body mass index is 24.21 kg/m.  General appearance:  Normal affect, orientation and appearance. Skin: Grossly normal HEENT: Without gross lesions.  No cervical or supraclavicular adenopathy. Thyroid normal.  Lungs:  Clear without wheezing, rales or rhonchi Cardiac: RR, without RMG Abdominal:  Soft, nontender, without masses, guarding, rebound, organomegaly or hernia Breasts:  Examined lying and sitting without masses, retractions, discharge or axillary adenopathy. Pelvic:  Ext, BUS, Vagina: Normal with mild atrophic changes  Cervix: Normal with mild atrophic changes  Uterus: Anteverted, normal size, shape and contour, midline and mobile nontender   Adnexa: Without masses or tenderness    Anus and perineum: Normal   Rectovaginal: Normal sphincter tone without palpated masses or tenderness.    Assessment/Plan:  58 y.o. G49P2002 female for annual gynecologic exam.   1. Mild suprapubic discomfort attributable to exercise.  Exam is normal.  History of echogenic right ovarian mass followed for years stable.  Will check baseline ultrasound  now for reassurance and patient will schedule.  Also check urine analysis. 2. Mammography 07/2018.  Continue with annual mammography when due.  Breast exam normal today. 3. Colonoscopy 2018.  Repeat at their recommended interval. 4. Pap smear/HPV 2019.  No Pap smear done today.  No history of significant abnormal Pap smears.  Plan repeat Pap smear/HPV at 5-year interval per current screening guidelines. 5. Health maintenance.  No routine lab work done as patient does this elsewhere.  Follow-up for ultrasound.  Follow-up in 1 year for annual exam   Anastasio Auerbach MD, 4:22 PM 03/17/2019

## 2019-03-18 ENCOUNTER — Encounter: Payer: Self-pay | Admitting: Gynecology

## 2019-03-18 LAB — URINALYSIS, COMPLETE W/RFL CULTURE
Bacteria, UA: NONE SEEN /HPF
Bilirubin Urine: NEGATIVE
Glucose, UA: NEGATIVE
Hgb urine dipstick: NEGATIVE
Hyaline Cast: NONE SEEN /LPF
Ketones, ur: NEGATIVE
Leukocyte Esterase: NEGATIVE
Nitrites, Initial: NEGATIVE
Protein, ur: NEGATIVE
RBC / HPF: NONE SEEN /HPF (ref 0–2)
Specific Gravity, Urine: 1.025 (ref 1.001–1.03)
WBC, UA: NONE SEEN /HPF (ref 0–5)
pH: 6 (ref 5.0–8.0)

## 2019-03-18 LAB — NO CULTURE INDICATED

## 2019-03-29 ENCOUNTER — Encounter (HOSPITAL_COMMUNITY): Payer: Self-pay

## 2019-03-29 ENCOUNTER — Other Ambulatory Visit: Payer: Self-pay

## 2019-03-29 ENCOUNTER — Ambulatory Visit (HOSPITAL_COMMUNITY)
Admission: EM | Admit: 2019-03-29 | Discharge: 2019-03-29 | Disposition: A | Discharge status: Home / Self Care | Payer: BC Managed Care – PPO | Attending: Emergency Medicine | Admitting: Emergency Medicine

## 2019-03-29 DIAGNOSIS — M940 Chondrocostal junction syndrome [Tietze]: Secondary | ICD-10-CM

## 2019-03-29 MED ORDER — IBUPROFEN 800 MG PO TABS: 800.0000 mg | ORAL_TABLET | Freq: Three times a day (TID) | ORAL | 0 refills | Status: DC | PRN

## 2019-03-29 NOTE — ED Triage Notes (Signed)
Pt present right side rib cage pain, symptoms started on Wednesday. Pt denies any injury to the rib.

## 2019-03-29 NOTE — ED Provider Notes (Signed)
[place]    CSN: [id number] Arrival date & time: 03/29/19  1008      History   Chief Complaint Chief Complaint  Patient presents with  . Rib cage pain    Right side     HPI Mary Hughes is a 58 y.o. female.   Patient presents with pain in her right lateral rib cage x4 days.  She denies fall or injury.  She states she was playing golf the day before the pain started and it got worse after an exercise class.  She states the pain is worse with deep breathing and movement; and improves with rest.  She denies fever, chills, cough, shortness of breath, breast pain, or other symptoms.  She took Tylenol yesterday without relief.    The history is provided by the patient.    Past Medical History:  Diagnosis Date  . Hypertension   . Ovarian mass 06/2005   RIGHT OVARIAN ECHOGENIC MASS  . S/P endometrial ablation 10/07  . Thyroid disease    Hypo thyroid    Patient Active Problem List   Diagnosis Date Noted  . Hashimoto's thyroiditis 03/12/2015  . 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 3rd left Metatarsophalangeal joint sprain 03/12/2014  . Anemia 04/03/2012  . GI bleed 04/02/2012  . Ovarian mass 06/20/2007    Past Surgical History:  Procedure Laterality Date  . COLONOSCOPY  04/03/2012   Procedure: COLONOSCOPY;  Surgeon: Wonda Horner, MD;  Location: [facility];  Service: Endoscopy;  Laterality: N/A;  . ENDOMETRIAL ABLATION  04/18/2006  . HIP SURGERY  08/2004   LEFT HIP REPLACEMENT  . HYSTEROSCOPY  04/18/2006   HYST, D&C WITH NOVASURE  . JOINT REPLACEMENT     left hip  . PILONIDAL CYST DRAINAGE     Removed    OB History    Gravida  2   Para  2   Term  2   Preterm      AB      Living  2     SAB      TAB      Ectopic      Multiple      Live Births               Home Medications    Prior to Admission medications   Medication Sig Start Date End Date Taking? Authorizing  Provider  cholecalciferol (VITAMIN D) 1000 UNITS tablet Take 1,000 Units by mouth daily.    [provider]  ibuprofen (ADVIL) 800 MG tablet Take 1 tablet (800 mg total) by mouth every 8 (eight) hours as needed. 03/29/19   Sharion Balloon, NP  lisinopril (PRINIVIL,ZESTRIL) 5 MG tablet 10 mg.  08/02/15   [provider]  MINOCYCLINE HCL ER PO Take by mouth.    [provider]  Multiple Vitamin (MULTIVITAMIN) tablet Take 1 tablet by mouth daily. Alive over 69    [provider]  TIROSINT 50 MCG CAPS Take 1 capsule before breakfast daily 12/25/18   Philemon Kingdom, MD    Family History Family History  Problem Relation Age of Onset  . Hypertension Mother   . Lung cancer Mother   . Heart disease Father   . Cancer Father        Throat,Lymphoma  . Diabetes Maternal Grandmother   . Heart disease Maternal Grandfather     Social History Social History   Tobacco  Use  . Smoking status: Never Smoker  . Smokeless tobacco: Never Used  Substance Use Topics  . Alcohol use: Yes    Alcohol/week: 10.0 standard drinks    Types: 10 Standard drinks or equivalent per week  . Drug use: No     Allergies   Patient has no known allergies.   Review of Systems Review of Systems  Constitutional: Negative for chills and fever.  HENT: Negative for ear pain and sore throat.   Eyes: Negative for pain and visual disturbance.  Respiratory: Negative for cough and shortness of breath.   Cardiovascular: Negative for chest pain and palpitations.  Gastrointestinal: Negative for abdominal pain and vomiting.  Genitourinary: Negative for dysuria and hematuria.  Musculoskeletal: Negative for arthralgias and back pain.  Skin: Negative for color change and rash.  Neurological: Negative for seizures and syncope.  All other systems reviewed and are negative.    Physical Exam Triage Vital Signs ED Triage Vitals  Enc Vitals Group     BP      Pulse      Resp      Temp       Temp src      SpO2      Weight      Height      Head Circumference      Peak Flow      Pain Score      Pain Loc      Pain Edu?      Excl. in [place]?    No data found.  Updated Vital Signs BP (!) 129/92 (BP Location: Left Arm)   Pulse 85   Temp 99 F (37.2 C) (Oral)   Resp 18   SpO2 100%   Visual Acuity Right Eye Distance:   Left Eye Distance:   Bilateral Distance:    Right Eye Near:   Left Eye Near:    Bilateral Near:     Physical Exam Vitals signs and nursing note reviewed.  Constitutional:      General: She is not in acute distress.    Appearance: She is well-developed.  HENT:     Head: Normocephalic and atraumatic.  Eyes:     Conjunctiva/sclera: Conjunctivae normal.  Neck:     Musculoskeletal: Neck supple.  Cardiovascular:     Rate and Rhythm: Normal rate and regular rhythm.     Heart sounds: Normal heart sounds. No murmur.  Pulmonary:     Effort: Pulmonary effort is normal. No respiratory distress.     Breath sounds: Normal breath sounds. No wheezing or rhonchi.  Chest:    Abdominal:     Palpations: Abdomen is soft.     Tenderness: There is no abdominal tenderness. There is no guarding or rebound.  Musculoskeletal:        General: Tenderness present.     Comments: Right lateral ribcage tender to palpation below axilla and anterior.    Skin:    General: Skin is warm and dry.     Findings: No bruising, erythema, lesion or rash.  Neurological:     General: No focal deficit present.     Mental Status: She is alert and oriented to person, place, and time.      UC Treatments / Results  Labs (all labs ordered are listed, but only abnormal results are displayed) Labs Reviewed - No data to display  EKG   Radiology No results found.  Procedures Procedures (including critical care time)  Medications Ordered in UC  Medications - No data to display  Initial Impression / Assessment and Plan / UC Course  I have reviewed the triage vital signs and  the nursing notes.  Pertinent labs & imaging results that were available during my care of the patient were reviewed by me and considered in my medical decision making (see chart for details).    Costochondritis.  Treating with ibuprofen.  Instructed patient to follow-up with her PCP if her symptoms are not improving.  Instructed patient to return here or go to the emergency department if she has increased pain, difficulty breathing, or other concerning symptoms.  Patient agrees to plan of care.       Final Clinical Impressions(s) / UC Diagnoses   Final diagnoses:  Costochondritis     Discharge Instructions     Take the prescribed ibuprofen as directed.    Follow-up with your primary care provider if your symptoms are not improving.    Return here or go to the emergency department if you have increased pain, difficulty breathing, or other concerning symptoms.        ED Prescriptions    Medication Sig Dispense Auth. Provider   ibuprofen (ADVIL) 800 MG tablet Take 1 tablet (800 mg total) by mouth every 8 (eight) hours as needed. 21 tablet Sharion Balloon, NP     PDMP not reviewed this encounter.   Sharion Balloon, NP 03/29/19 1055

## 2019-03-29 NOTE — Discharge Instructions (Addendum)
Take the prescribed ibuprofen as directed.    Follow-up with your primary care provider if your symptoms are not improving.    Return here or go to the emergency department if you have increased pain, difficulty breathing, or other concerning symptoms.

## 2019-04-09 ENCOUNTER — Other Ambulatory Visit: Payer: Self-pay

## 2019-04-10 ENCOUNTER — Encounter: Payer: Self-pay | Admitting: Gynecology

## 2019-04-10 ENCOUNTER — Ambulatory Visit: Payer: BC Managed Care – PPO | Admitting: Gynecology

## 2019-04-10 ENCOUNTER — Ambulatory Visit (INDEPENDENT_AMBULATORY_CARE_PROVIDER_SITE_OTHER): Payer: BC Managed Care – PPO

## 2019-04-10 VITALS — BP 140/84

## 2019-04-10 DIAGNOSIS — N838 Other noninflammatory disorders of ovary, fallopian tube and broad ligament: Secondary | ICD-10-CM | POA: Diagnosis not present

## 2019-04-10 NOTE — Progress Notes (Signed)
    Mary Hughes [birth year] [id number]        58 y.o.  G2P2002 presents for ultrasound.  History of some ill-defined suprapubic discomfort intermittently which we think is due to exercise.  She does have a history of an echogenic area on her right ovary in the past that we wanted to follow-up on.  Past medical history,surgical history, problem list, medications, allergies, family history and social history were all reviewed and documented in the EPIC chart.  Directed ROS with pertinent positives and negatives documented in the history of present illness/assessment and plan.  Exam: Vitals:   04/10/19 0838  BP: 140/84   General appearance:  Normal  Ultrasound transvaginal shows uterus normal size with no myometrial abnormalities.  Endometrial echo thin at 1.8 mm.  Left ovary atrophic in appearance.  Right ovary with solid-appearing echogenic avascular 1.9 x 1.5 x 1.4 cms.  Cul-de-sac negative.  Assessment/Plan:  58 y.o. G2P2002 with stable echogenic mass right ovary when compared to her 2015 study.  All points towards benign process.  No intervention indicated at this time.  Patient will follow-up in a year when she is due for her annual exam, sooner if any issues.    Anastasio Auerbach MD, 9:05 AM 04/10/2019

## 2019-04-10 NOTE — Patient Instructions (Signed)
Follow-up in 1 year for annual exam.  Sooner if any issues.

## 2019-06-16 DIAGNOSIS — H33311 Horseshoe tear of retina without detachment, right eye: Secondary | ICD-10-CM | POA: Diagnosis not present

## 2019-06-16 DIAGNOSIS — H35372 Puckering of macula, left eye: Secondary | ICD-10-CM | POA: Diagnosis not present

## 2019-06-16 DIAGNOSIS — H33301 Unspecified retinal break, right eye: Secondary | ICD-10-CM | POA: Diagnosis not present

## 2019-06-16 DIAGNOSIS — H4311 Vitreous hemorrhage, right eye: Secondary | ICD-10-CM | POA: Diagnosis not present

## 2019-06-16 DIAGNOSIS — H43812 Vitreous degeneration, left eye: Secondary | ICD-10-CM | POA: Diagnosis not present

## 2019-06-21 ENCOUNTER — Other Ambulatory Visit: Payer: Self-pay | Admitting: Internal Medicine

## 2019-06-23 DIAGNOSIS — H43392 Other vitreous opacities, left eye: Secondary | ICD-10-CM | POA: Diagnosis not present

## 2019-06-23 DIAGNOSIS — H35372 Puckering of macula, left eye: Secondary | ICD-10-CM | POA: Diagnosis not present

## 2019-06-23 DIAGNOSIS — H43812 Vitreous degeneration, left eye: Secondary | ICD-10-CM | POA: Diagnosis not present

## 2019-06-23 DIAGNOSIS — H33311 Horseshoe tear of retina without detachment, right eye: Secondary | ICD-10-CM | POA: Diagnosis not present

## 2019-06-30 ENCOUNTER — Other Ambulatory Visit: Payer: Self-pay | Admitting: Obstetrics and Gynecology

## 2019-06-30 DIAGNOSIS — Z1231 Encounter for screening mammogram for malignant neoplasm of breast: Secondary | ICD-10-CM

## 2019-07-08 ENCOUNTER — Telehealth: Payer: Self-pay

## 2019-07-08 NOTE — Telephone Encounter (Signed)
Former Dr. Wynonia Lawman patient. Would like to schedule an appointment. Per Rose in chart prep, ok to schedule. 07/08/19 vlm

## 2019-07-13 NOTE — Progress Notes (Signed)
Cardiology Office Note   Date:  07/14/2019   ID:  Mary Hughes, DOB Oct 09, 1960, MRN [id number]  PCP:  Gaynelle Arabian, MD  Cardiologist:   Dorris Carnes, MD   Pt presents for continued cardiac care of HTN and MV dz      History of Present Illness: Mary Hughes is a 59 y.o. female with a history of HTN  And mitral regurgitation  who presents for continued care   [facility] was previously followed by Thurman Coyer    She was last seen in 2018 The pt had an echo in 2018 that showed mild LVH and mild to mod MR    Since seen the pt has done OK from a cardiac standpoint.   She has some chest pains that are probably musculoskel, radiating from back   She denies CP with activity   She is active.    No SOB    The pt does say he BP is up and down    Diastolic is in 123XX123 to 0000000      Seen today by ophthy for retinal tear on R eye    Current Meds  Medication Sig  . cholecalciferol (VITAMIN D) 1000 UNITS tablet Take 2,000 Units by mouth daily.   . Clindamycin-Benzoyl Per, Refr, gel Apply 1 application topically as directed.   Marland Kitchen ibuprofen (ADVIL) 800 MG tablet Take 1 tablet (800 mg total) by mouth every 8 (eight) hours as needed.  Marland Kitchen lisinopril (PRINIVIL,ZESTRIL) 5 MG tablet Take 20 mg by mouth daily.   Marland Kitchen MINOCYCLINE HCL ER PO Take by mouth.  . Multiple Vitamin (MULTI VITAMIN DAILY PO) Take by mouth.  . TIROSINT 50 MCG CAPS TAKE 1 CAPSULE BEFORE BREAKFAST DAILY     Allergies:   Patient has no known allergies.   Past Medical History:  Diagnosis Date  . Hypertension   . Ovarian mass 06/2005   RIGHT OVARIAN ECHOGENIC MASS  . S/P endometrial ablation 10/07  . Thyroid disease    Hypo thyroid    Past Surgical History:  Procedure Laterality Date  . COLONOSCOPY  04/03/2012   Procedure: COLONOSCOPY;  Surgeon: Wonda Horner, MD;  Location: [facility];  Service: Endoscopy;  Laterality: N/A;  . ENDOMETRIAL ABLATION  04/18/2006  . HIP SURGERY  08/2004   LEFT HIP REPLACEMENT  . HYSTEROSCOPY   04/18/2006   HYST, D&C WITH NOVASURE  . JOINT REPLACEMENT     left hip  . PILONIDAL CYST DRAINAGE     Removed     Social History:  The patient  reports that she has never smoked. She has never used smokeless tobacco. She reports current alcohol use of about 10.0 standard drinks of alcohol per week. She reports that she does not use drugs.   Family History:  The patient's family history includes Cancer in her father; Diabetes in her maternal grandmother; Heart disease in her father and maternal grandfather; Hypertension in her mother; Lung cancer in her mother.    ROS:  Please see the history of present illness. All other systems are reviewed and  Negative to the above problem except as noted.    PHYSICAL EXAM: VS:  BP (!) 140/92   Pulse 98   Ht 5\' 6"  (1.676 m)   Wt 154 lb 12.8 oz (70.2 kg)   BMI 24.99 kg/m   GEN: Well nourished, well developed, in no acute distress  HEENT: normal  Neck: no JVD, carotid bruits  Cardiac: RRR; no murmurs, rubs, or  gallops,no LE edema  Respiratory:  clear to auscultation bilaterally  GI: soft, nontender, nondistended, + BS  No hepatomegaly  MS: no deformity Moving all extremities   Skin: warm and dry, no rash Neuro:  Strength and sensation are intact Psych: euthymic mood, full affect   EKG:  EKG is ordered today.  SR 98 bpm    Lipid Panel    Component Value Date/Time   CHOL 172 01/18/2015 0930   TRIG 60 01/18/2015 0930   HDL 73 01/18/2015 0930   CHOLHDL 2.4 01/18/2015 0930   VLDL 12 01/18/2015 0930   LDLCALC 87 01/18/2015 0930      Wt Readings from Last 3 Encounters:  07/14/19 154 lb 12.8 oz (70.2 kg)  03/17/19 150 lb (68 kg)  09/09/18 149 lb (67.6 kg)      ASSESSMENT AND PLAN:  1.  HTN   Pt has had HTN for several years.   It sounds like her BP is labile, often running a little high.   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   Will get echo to reeval chamber sizes   Contiue to work with BP   3  HC  LDL in 2018 was 98   Recheck          Current medicines are reviewed at length with the patient today.  The patient does not have concerns regarding medicines.  Signed, Dorris Carnes, MD  07/14/2019 12:00 PM    [address], [place], [place]  [address] Phone: [phone]; Fax: [phone]

## 2019-07-14 ENCOUNTER — Ambulatory Visit: Payer: BC Managed Care – PPO | Admitting: Internal Medicine

## 2019-07-14 ENCOUNTER — Encounter: Payer: Self-pay | Admitting: Internal Medicine

## 2019-07-14 ENCOUNTER — Other Ambulatory Visit: Payer: Self-pay

## 2019-07-14 VITALS — BP 140/92 | HR 98 | Ht 66.0 in | Wt 154.8 lb

## 2019-07-14 DIAGNOSIS — I341 Nonrheumatic mitral (valve) prolapse: Secondary | ICD-10-CM | POA: Diagnosis not present

## 2019-07-14 DIAGNOSIS — I10 Essential (primary) hypertension: Secondary | ICD-10-CM

## 2019-07-14 DIAGNOSIS — H33311 Horseshoe tear of retina without detachment, right eye: Secondary | ICD-10-CM | POA: Diagnosis not present

## 2019-07-14 DIAGNOSIS — H4311 Vitreous hemorrhage, right eye: Secondary | ICD-10-CM | POA: Diagnosis not present

## 2019-07-14 DIAGNOSIS — H43392 Other vitreous opacities, left eye: Secondary | ICD-10-CM | POA: Diagnosis not present

## 2019-07-14 NOTE — Patient Instructions (Signed)
Medication Instructions:  No changes *If you need a refill on your cardiac medications before your next appointment, please call your pharmacy*  Lab Work: Today: cbc, bmet If you have labs (blood work) drawn today and your tests are completely normal, you will receive your results only by: Marland Kitchen MyChart Message (if you have MyChart) OR . A paper copy in the mail If you have any lab test that is abnormal or we need to change your treatment, we will call you to review the results.  Testing/Procedures: Your physician has requested that you have an echocardiogram. Echocardiography is a painless test that uses sound waves to create images of your heart. It provides your doctor with information about the size and shape of your heart and how well your heart's chambers and valves are working. This procedure takes approximately one hour. There are no restrictions for this procedure.   Follow-Up: Follow up with your physician will depend on test results.  Other Instructions

## 2019-07-15 LAB — BASIC METABOLIC PANEL
BUN/Creatinine Ratio: 18 (ref 9–23)
BUN: 13 mg/dL (ref 6–24)
CO2: 25 mmol/L (ref 20–29)
Calcium: 10.2 mg/dL (ref 8.7–10.2)
Chloride: 100 mmol/L (ref 96–106)
Creatinine, Ser: 0.72 mg/dL (ref 0.57–1.00)
GFR calc Af Amer: 108 mL/min/{1.73_m2} (ref >59)
GFR calc non Af Amer: 94 mL/min/{1.73_m2} (ref >59)
Glucose: 113 mg/dL — ABNORMAL HIGH (ref 65–99)
Potassium: 4.3 mmol/L (ref 3.5–5.2)
Sodium: 140 mmol/L (ref 134–144)

## 2019-07-15 LAB — CBC
Hematocrit: 39.3 % (ref 34.0–46.6)
Hemoglobin: 13.3 g/dL (ref 11.1–15.9)
MCH: 32.3 pg (ref 26.6–33.0)
MCHC: 33.8 g/dL (ref 31.5–35.7)
MCV: 95 fL (ref 79–97)
Platelets: 299 10*3/uL (ref 150–450)
RBC: 4.12 x10E6/uL (ref 3.77–5.28)
RDW: 12.6 % (ref 11.7–15.4)
WBC: 7.9 10*3/uL (ref 3.4–10.8)

## 2019-07-16 ENCOUNTER — Other Ambulatory Visit: Payer: Self-pay | Admitting: *Deleted

## 2019-07-16 MED ORDER — HYDROCHLOROTHIAZIDE 12.5 MG PO CAPS: 12.5000 mg | ORAL_CAPSULE | Freq: Every day | ORAL | 3 refills | Status: DC

## 2019-07-22 ENCOUNTER — Other Ambulatory Visit: Payer: Self-pay

## 2019-07-22 ENCOUNTER — Ambulatory Visit (HOSPITAL_COMMUNITY): Payer: BC Managed Care – PPO | Attending: Internal Medicine

## 2019-07-22 DIAGNOSIS — I10 Essential (primary) hypertension: Secondary | ICD-10-CM

## 2019-07-22 DIAGNOSIS — I341 Nonrheumatic mitral (valve) prolapse: Secondary | ICD-10-CM

## 2019-08-06 DIAGNOSIS — D225 Melanocytic nevi of trunk: Secondary | ICD-10-CM | POA: Diagnosis not present

## 2019-08-06 DIAGNOSIS — D2272 Melanocytic nevi of left lower limb, including hip: Secondary | ICD-10-CM | POA: Diagnosis not present

## 2019-08-06 DIAGNOSIS — I788 Other diseases of capillaries: Secondary | ICD-10-CM | POA: Diagnosis not present

## 2019-08-06 DIAGNOSIS — D224 Melanocytic nevi of scalp and neck: Secondary | ICD-10-CM | POA: Diagnosis not present

## 2019-08-08 ENCOUNTER — Ambulatory Visit
Admission: RE | Admit: 2019-08-08 | Discharge: 2019-08-08 | Disposition: A | Discharge status: Home / Self Care | Payer: BC Managed Care – PPO | Source: Ambulatory Visit | Attending: Obstetrics and Gynecology | Admitting: Obstetrics and Gynecology

## 2019-08-08 ENCOUNTER — Other Ambulatory Visit: Payer: Self-pay

## 2019-08-08 ENCOUNTER — Ambulatory Visit: Payer: BC Managed Care – PPO

## 2019-08-08 DIAGNOSIS — Z03818 Encounter for observation for suspected exposure to other biological agents ruled out: Secondary | ICD-10-CM | POA: Diagnosis not present

## 2019-08-08 DIAGNOSIS — Z1231 Encounter for screening mammogram for malignant neoplasm of breast: Secondary | ICD-10-CM | POA: Diagnosis not present

## 2019-08-12 DIAGNOSIS — H4311 Vitreous hemorrhage, right eye: Secondary | ICD-10-CM | POA: Diagnosis not present

## 2019-08-12 DIAGNOSIS — H33311 Horseshoe tear of retina without detachment, right eye: Secondary | ICD-10-CM | POA: Diagnosis not present

## 2019-08-19 DIAGNOSIS — Z20828 Contact with and (suspected) exposure to other viral communicable diseases: Secondary | ICD-10-CM | POA: Diagnosis not present

## 2019-08-19 DIAGNOSIS — Z03818 Encounter for observation for suspected exposure to other biological agents ruled out: Secondary | ICD-10-CM | POA: Diagnosis not present

## 2019-08-20 DIAGNOSIS — Z03818 Encounter for observation for suspected exposure to other biological agents ruled out: Secondary | ICD-10-CM | POA: Diagnosis not present

## 2019-08-20 DIAGNOSIS — Z20828 Contact with and (suspected) exposure to other viral communicable diseases: Secondary | ICD-10-CM | POA: Diagnosis not present

## 2019-09-01 ENCOUNTER — Ambulatory Visit: Payer: BC Managed Care – PPO | Attending: Internal Medicine

## 2019-09-01 DIAGNOSIS — Z23 Encounter for immunization: Secondary | ICD-10-CM

## 2019-09-01 NOTE — Progress Notes (Signed)
   Covid-19 Vaccination Clinic  Name:  Mary Hughes    MRN: [id number] DOB: 1960/08/10  09/01/2019  [name] was observed post Covid-19 immunization for 15 minutes without incident. She was provided with Vaccine Information Sheet and instruction to access the V-Safe system.   [name] was instructed to call 911 with any severe reactions post vaccine: Marland Kitchen Difficulty breathing  . Swelling of face and throat  . A fast heartbeat  . A bad rash all over body  . Dizziness and weakness   Immunizations Administered    Name Date Dose VIS Date Route   Pfizer COVID-19 Vaccine 09/01/2019  5:01 PM 0.3 mL 05/30/2019 Intramuscular   Manufacturer: [employer] [employer]   Lot: [id number]   [place]: [id number]

## 2019-09-05 ENCOUNTER — Other Ambulatory Visit: Payer: Self-pay

## 2019-09-08 DIAGNOSIS — H31091 Other chorioretinal scars, right eye: Secondary | ICD-10-CM | POA: Diagnosis not present

## 2019-09-08 DIAGNOSIS — H35033 Hypertensive retinopathy, bilateral: Secondary | ICD-10-CM | POA: Diagnosis not present

## 2019-09-08 DIAGNOSIS — H35372 Puckering of macula, left eye: Secondary | ICD-10-CM | POA: Diagnosis not present

## 2019-09-08 DIAGNOSIS — H43812 Vitreous degeneration, left eye: Secondary | ICD-10-CM | POA: Diagnosis not present

## 2019-09-09 ENCOUNTER — Encounter: Payer: Self-pay | Admitting: Internal Medicine

## 2019-09-09 ENCOUNTER — Other Ambulatory Visit: Payer: Self-pay

## 2019-09-09 ENCOUNTER — Ambulatory Visit: Payer: BC Managed Care – PPO | Admitting: Internal Medicine

## 2019-09-09 VITALS — BP 128/80 | HR 74 | Ht 66.0 in | Wt 150.0 lb

## 2019-09-09 DIAGNOSIS — E038 Other specified hypothyroidism: Secondary | ICD-10-CM

## 2019-09-09 DIAGNOSIS — E063 Autoimmune thyroiditis: Secondary | ICD-10-CM | POA: Diagnosis not present

## 2019-09-09 DIAGNOSIS — K146 Glossodynia: Secondary | ICD-10-CM | POA: Diagnosis not present

## 2019-09-09 LAB — T4, FREE: Free T4: 0.83 ng/dL (ref 0.60–1.60)

## 2019-09-09 LAB — TSH: TSH: 2.87 u[IU]/mL (ref 0.35–4.50)

## 2019-09-09 NOTE — Patient Instructions (Signed)
Please continue Tirosint 50 mcg daily.  Take the thyroid hormone every day, with water, at least 30 minutes before breakfast, separated by at least 4 hours from: - acid reflux medications - calcium - iron - multivitamins  Please stop at the lab.  Try to stop Minocycline an come back for labs in 5-6 weeks.  Please come back for a follow-up appointment in 1 year.

## 2019-09-09 NOTE — Progress Notes (Signed)
Patient ID: Mary Hughes, female   DOB: 03-16-1961, 59 y.o.   MRN: [id number]   HPI  Mary Hughes is a 59 y.o.-year-old female, initially referred by her ObGyn Dr., Dr Phineas Real, returning for f/u for Hashimoto's hypothyroidism. Last visit 1 year ago.  Reviewed and addended history: Pt. has been dx with Hashimoto's thyroiditis in 01/2015.  Retrospectively, she had a thyroid U/S in 2002 for enlarged thyroid >> aspect of the thyroid was heterogeneous >> thyroid inflammation has been present since then.  No thyroid nodules.  We started levothyroxine 10/2018.  She had tongue burning and tingling with generic levothyroxine >> switched to Tirosint 12/2018.  She takes Tirosint 50 mcg daily: - in am - fasting - at least 30 min from b'fast - no Ca, Fe, PPIs - + MVI at night - not on Biotin  Reviewed her TFTs: Lab Results  Component Value Date   TSH 2.27 12/13/2018   TSH 9.20 (H) 10/30/2018   TSH 6.20 (H) 09/09/2018   TSH 3.98 10/22/2017   TSH 4.70 (H) 09/07/2017   TSH 3.85 09/08/2016   TSH 4.43 03/16/2016   TSH 4.15 11/09/2015   TSH 4.91 (H) 09/09/2015   TSH 3.55 03/12/2015   FREET4 0.69 12/13/2018   FREET4 0.80 10/30/2018   FREET4 0.53 (L) 09/09/2018   FREET4 0.53 (L) 10/22/2017   FREET4 0.53 (L) 09/07/2017   FREET4 0.76 09/08/2016   FREET4 0.58 (L) 03/16/2016   FREET4 0.62 11/09/2015   FREET4 0.69 09/09/2015   FREET4 0.61 03/12/2015   T3FREE 3.3 09/09/2018   T3FREE 3.2 09/07/2017   T3FREE 3.4 09/08/2016   T3FREE 3.3 03/16/2016   T3FREE 2.9 11/09/2015   T3FREE 3.1 09/09/2015   T3FREE 3.2 03/12/2015   She has very high TPO antibodies: Component     Latest Ref Rng & Units 09/09/2018  Thyroperoxidase Ab SerPl-aCnc     <9 IU/mL >900 (H)   Component     Latest Ref Rng & Units 01/21/2015 09/09/2015 (on selenium) 09/08/2016 09/07/2017  Thyroperoxidase Ab SerPl-aCnc     <9 IU/mL >900 (H) >900 (H) >900 (H) >900 (H)   We tried selenium 200 mg daily 08/2016, but we stopped  since her antibodies remain high.  She continues to exercise: Walking, Pilates.  Pt denies: - feeling nodules in neck - hoarseness - dysphagia - choking - SOB with lying down  She has + FH of thyroid disorders in: sister with Hashimoto's thyroiditis, + thyroid nodule in daughter.  No FH of thyroid cancer. No h/o radiation tx to head or neck. No herbal supplements. No Biotin use. No recent steroids use.   She also has HTN (dx 2016), GERD. She had cataracts removed  (hereditary cataracts).  She continues on minocycline for her skin.  She has been on this for many years.  She has moved it every other day.  ROS: Constitutional: no weight gain/no weight loss, no fatigue, no subjective hyperthermia, no subjective hypothermia Eyes: no blurry vision, no xerophthalmia ENT: no sore throat, + see HPI, + burning tongue Cardiovascular: no CP/no SOB/no palpitations/no leg swelling Respiratory: no cough/no SOB/no wheezing Gastrointestinal: no N/no V/no D/no C/no acid reflux Musculoskeletal: no muscle aches/no joint aches Skin: no rashes, no hair loss Neurological: no tremors/no numbness/no tingling/no dizziness  I reviewed pt's medications, allergies, PMH, social hx, family hx, and changes were documented in the history of present illness. Otherwise, unchanged from my initial visit note.  Past Medical History:  Diagnosis Date  . Hypertension   .  Ovarian mass 06/2005   RIGHT OVARIAN ECHOGENIC MASS  . S/P endometrial ablation 10/07  . Thyroid disease    Hypo thyroid   Past Surgical History:  Procedure Laterality Date  . COLONOSCOPY  04/03/2012   Procedure: COLONOSCOPY;  Surgeon: Wonda Horner, MD;  Location: [facility];  Service: Endoscopy;  Laterality: N/A;  . ENDOMETRIAL ABLATION  04/18/2006  . HIP SURGERY  08/2004   LEFT HIP REPLACEMENT  . HYSTEROSCOPY  04/18/2006   HYST, D&C WITH NOVASURE  . JOINT REPLACEMENT     left hip  . PILONIDAL CYST DRAINAGE     Removed   Social  History   Social History  . Marital Status: Married    Spouse Name: N/A  . Number of Children: 2   Occupational History  . COO at [employer]   Social History Main Topics  . Smoking status: Never Smoker   . Smokeless tobacco: Never Used  . Alcohol Use: 6.0 oz/week    Wine, beer - 5x a week, 2-3 Standard drinks or equivalent  . Drug Use: No  . Sexual Activity: Yes    Birth Control/ Protection: Other-see comments     Comment: Vasectomy-1st intercourse 59 yo-Fewer than 5 partners   Current Outpatient Medications on File Prior to Visit  Medication Sig Dispense Refill  . cholecalciferol (VITAMIN D) 1000 UNITS tablet Take 2,000 Units by mouth daily.     . Clindamycin-Benzoyl Per, Refr, gel Apply 1 application topically as directed.     . hydrochlorothiazide (MICROZIDE) 12.5 MG capsule Take 1 capsule (12.5 mg total) by mouth daily. 90 capsule 3  . ibuprofen (ADVIL) 800 MG tablet Take 1 tablet (800 mg total) by mouth every 8 (eight) hours as needed. 21 tablet 0  . lisinopril (PRINIVIL,ZESTRIL) 5 MG tablet Take 20 mg by mouth daily.   3  . MINOCYCLINE HCL ER PO Take by mouth.    . Multiple Vitamin (MULTI VITAMIN DAILY PO) Take by mouth.    . TIROSINT 50 MCG CAPS TAKE 1 CAPSULE BEFORE BREAKFAST DAILY 90 capsule 1   No current facility-administered medications on file prior to visit.   No Known Allergies Family History  Problem Relation Age of Onset  . Hypertension Mother   . Lung cancer Mother   . Heart disease Father   . Cancer Father        Throat,Lymphoma  . Diabetes Maternal Grandmother   . Heart disease Maternal Grandfather    PE: BP 128/80   Pulse 74   Ht 5\' 6"  (1.676 m)   Wt 150 lb (68 kg)   SpO2 97%   BMI 24.21 kg/m  Body mass index is 24.21 kg/m. Wt Readings from Last 3 Encounters:  09/09/19 150 lb (68 kg)  07/14/19 154 lb 12.8 oz (70.2 kg)  03/17/19 150 lb (68 kg)   Constitutional: normal weight, in NAD Eyes: PERRLA, EOMI, no exophthalmos ENT:  moist mucous membranes, no thyromegaly, no cervical lymphadenopathy Cardiovascular: RRR, No MRG Respiratory: CTA B Gastrointestinal: abdomen soft, NT, ND, BS+ Musculoskeletal: no deformities, strength intact in all 4 Skin: moist, warm, no rashes Neurological: no tremor with outstretched hands, DTR normal in all 4  ASSESSMENT: 1. Hashimoto thyroiditis - Thyroid ultrasound (07/26/2000):  THE THYROID GLAND IS MILDLY HETEROGENEOUS WITHOUT DEFINITE FOCAL LESIONS OR CYSTS.   THE RIGHT LOBEOF THE THYROID MEASURES 1.1 X 1.5 X 4.5 CM   THE LEFT LOBE OF THE THYROID MEASURES 1.4 X 1.1 X 4.3 CM.  NO OTHER ABNORMALITIES ARE IDENTIFIED.  2.  Burning tongue  PLAN: 1. Hashimoto's hypothyroidism -Patient with previous history of euthyroid Hashimoto's thyroiditis, with very high TPO antibodies, which would not be managed by starting selenium.  We stopped this almost 3 years ago.  At last visit, her antibodies were still elevated and her TSH was so we ended up starting levothyroxine.  She had burning and tingling of her tongue with the levothyroxine p.o. so we switched to Tirosint (liquid LT4) in 12/2018.  However, the symptoms continue. - latest thyroid labs reviewed with pt >> normal: Lab Results  Component Value Date   TSH 2.27 12/13/2018   - she continues on LT4 50 mcg daily - pt feels good on this dose. - we discussed about taking the thyroid hormone every day, with water, >30 minutes before breakfast, separated by >4 hours from acid reflux medications, calcium, iron, multivitamins. Pt. is taking it correctly. - will check thyroid tests today: TSH and fT4 - If labs are abnormal, she will need to return for repeat TFTs in 1.5 months - OTW, I will see her back in a year  2.  Burning tongue -We discussed that it would be unlikely that this is due to levothyroxine, especially since she is now on Tirosint, which is free of coloring agents and on the recipients, and there appeared -High thyroid  antibodies would be quite unlikely to cause this symptom -Zinc deficiency can also cause this and she is on a diuretic but she started to take a multivitamin a month ago and the symptoms did not improve -Reviewing her medication list, she is on minocycline, which is known to be able to cause "black thyroid", a form of pigmented thyroiditis.  The medication also has tongue swelling as a side effect.  After reviewing this information, patient decided to come off minocycline.  We discussed that her thyroid function may improve after this.  I would like to have her back in 1.5 months to repeat her TFTs after stopping minocycline.  Addendum (10/13/2019): Labs pending.  Philemon Kingdom, MD PhD [facility] Endocrinology

## 2019-09-10 ENCOUNTER — Other Ambulatory Visit: Payer: Self-pay | Admitting: Internal Medicine

## 2019-09-10 DIAGNOSIS — E038 Other specified hypothyroidism: Secondary | ICD-10-CM

## 2019-09-10 DIAGNOSIS — E063 Autoimmune thyroiditis: Secondary | ICD-10-CM

## 2019-09-10 LAB — THYROID PEROXIDASE ANTIBODY: Thyroperoxidase Ab SerPl-aCnc: >900 IU/mL — ABNORMAL HIGH (ref <9)

## 2019-09-24 ENCOUNTER — Ambulatory Visit: Payer: BC Managed Care – PPO | Attending: Internal Medicine

## 2019-09-24 DIAGNOSIS — Z23 Encounter for immunization: Secondary | ICD-10-CM

## 2019-09-24 NOTE — Progress Notes (Signed)
   Covid-19 Vaccination Clinic  Name:  ALMETER MORINE    MRN: [id number] DOB: 12-26-1960  09/24/2019  Ms. Eanes was observed post Covid-19 immunization for 15 minutes without incident. She was provided with Vaccine Information Sheet and instruction to access the V-Safe system.   Ms. Fimbres was instructed to call 911 with any severe reactions post vaccine: Marland Kitchen Difficulty breathing  . Swelling of face and throat  . A fast heartbeat  . A bad rash all over body  . Dizziness and weakness   Immunizations Administered    Name Date Dose VIS Date Route   Pfizer COVID-19 Vaccine 09/24/2019  1:42 PM 0.3 mL 05/30/2019 Intramuscular   Manufacturer: [place]   Lot: [id number]   [place]: [id number]

## 2019-10-20 DIAGNOSIS — H31091 Other chorioretinal scars, right eye: Secondary | ICD-10-CM | POA: Diagnosis not present

## 2019-10-20 DIAGNOSIS — H35372 Puckering of macula, left eye: Secondary | ICD-10-CM | POA: Diagnosis not present

## 2019-10-20 DIAGNOSIS — H4311 Vitreous hemorrhage, right eye: Secondary | ICD-10-CM | POA: Diagnosis not present

## 2019-10-20 DIAGNOSIS — H35033 Hypertensive retinopathy, bilateral: Secondary | ICD-10-CM | POA: Diagnosis not present

## 2019-11-07 DIAGNOSIS — J069 Acute upper respiratory infection, unspecified: Secondary | ICD-10-CM | POA: Diagnosis not present

## 2019-11-07 DIAGNOSIS — I10 Essential (primary) hypertension: Secondary | ICD-10-CM | POA: Diagnosis not present

## 2019-11-12 DIAGNOSIS — H43813 Vitreous degeneration, bilateral: Secondary | ICD-10-CM | POA: Diagnosis not present

## 2019-11-12 DIAGNOSIS — H40013 Open angle with borderline findings, low risk, bilateral: Secondary | ICD-10-CM | POA: Diagnosis not present

## 2019-11-12 DIAGNOSIS — H524 Presbyopia: Secondary | ICD-10-CM | POA: Diagnosis not present

## 2019-12-19 ENCOUNTER — Other Ambulatory Visit: Payer: Self-pay | Admitting: Internal Medicine

## 2020-02-09 DIAGNOSIS — K146 Glossodynia: Secondary | ICD-10-CM | POA: Diagnosis not present

## 2020-02-09 DIAGNOSIS — E063 Autoimmune thyroiditis: Secondary | ICD-10-CM | POA: Diagnosis not present

## 2020-02-09 DIAGNOSIS — M720 Palmar fascial fibromatosis [Dupuytren]: Secondary | ICD-10-CM | POA: Diagnosis not present

## 2020-02-09 DIAGNOSIS — Z23 Encounter for immunization: Secondary | ICD-10-CM | POA: Diagnosis not present

## 2020-02-09 DIAGNOSIS — I10 Essential (primary) hypertension: Secondary | ICD-10-CM | POA: Diagnosis not present

## 2020-02-25 DIAGNOSIS — L245 Irritant contact dermatitis due to other chemical products: Secondary | ICD-10-CM | POA: Diagnosis not present

## 2020-02-25 DIAGNOSIS — L304 Erythema intertrigo: Secondary | ICD-10-CM | POA: Diagnosis not present

## 2020-02-28 DIAGNOSIS — Z20822 Contact with and (suspected) exposure to covid-19: Secondary | ICD-10-CM | POA: Diagnosis not present

## 2020-03-14 ENCOUNTER — Encounter (HOSPITAL_COMMUNITY): Payer: Self-pay | Admitting: *Deleted

## 2020-03-14 ENCOUNTER — Ambulatory Visit (HOSPITAL_COMMUNITY)
Admission: EM | Admit: 2020-03-14 | Discharge: 2020-03-14 | Disposition: A | Discharge status: Home / Self Care | Payer: BC Managed Care – PPO | Attending: Family Medicine | Admitting: Family Medicine

## 2020-03-14 ENCOUNTER — Other Ambulatory Visit: Payer: Self-pay

## 2020-03-14 DIAGNOSIS — N390 Urinary tract infection, site not specified: Secondary | ICD-10-CM

## 2020-03-14 DIAGNOSIS — R109 Unspecified abdominal pain: Secondary | ICD-10-CM

## 2020-03-14 LAB — POCT URINALYSIS DIPSTICK, ED / UC
Bilirubin Urine: NEGATIVE
Glucose, UA: NEGATIVE mg/dL
Ketones, ur: NEGATIVE mg/dL
Nitrite: NEGATIVE
Protein, ur: NEGATIVE mg/dL
Specific Gravity, Urine: 1.02 (ref 1.005–1.030)
Urobilinogen, UA: 0.2 mg/dL (ref 0.0–1.0)
pH: 7 (ref 5.0–8.0)

## 2020-03-14 MED ORDER — TAMSULOSIN HCL 0.4 MG PO CAPS: 0.4000 mg | ORAL_CAPSULE | Freq: Every day | ORAL | 0 refills | Status: DC

## 2020-03-14 MED ORDER — SULFAMETHOXAZOLE-TRIMETHOPRIM 800-160 MG PO TABS: 1.0000 | ORAL_TABLET | Freq: Two times a day (BID) | ORAL | 0 refills | Status: AC

## 2020-03-14 NOTE — ED Provider Notes (Signed)
[facility]    CSN: [id number] Arrival date & time: 03/14/20  1009      History   Chief Complaint Chief Complaint  Patient presents with  . Flank Pain    HPI Mary Hughes is a 59 y.o. female.   Here today with 2 days of suprapubic pressure, urinary frequency and urgency, dysuria and now sharp intermittent right sided lower abdominal/flank pain that seems to be worse in certain positions and at times 8/10 pain. Denies fever, chills, sweats, body aches, N/V/D, vaginal discharge or rashes, concern for STIs. Has tried some OTC urinary tract powder supplement without any relief. Hx of uterine ablation and right ovarian mass which per record review of GYN visit last year has been stable for years and is to be rechecked periodically. Otherwise, no hx of GU or GI issues.        Past Medical History:  Diagnosis Date  . Hypertension   . Ovarian mass 06/2005   RIGHT OVARIAN ECHOGENIC MASS  . S/P endometrial ablation 10/07  . Thyroid disease    Hypo thyroid    Patient Active Problem List   Diagnosis Date Noted  . Hashimoto's thyroiditis 03/12/2015  . 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 3rd left Metatarsophalangeal joint sprain 03/12/2014  . Anemia 04/03/2012  . GI bleed 04/02/2012  . Ovarian mass 06/20/2007    Past Surgical History:  Procedure Laterality Date  . COLONOSCOPY  04/03/2012   Procedure: COLONOSCOPY;  Surgeon: Wonda Horner, MD;  Location: [facility];  Service: Endoscopy;  Laterality: N/A;  . ENDOMETRIAL ABLATION  04/18/2006  . HIP SURGERY  08/2004   LEFT HIP REPLACEMENT  . HYSTEROSCOPY  04/18/2006   HYST, D&C WITH NOVASURE  . JOINT REPLACEMENT     left hip  . PILONIDAL CYST DRAINAGE     Removed    OB History    Gravida  2   Para  2   Term  2   Preterm      AB      Living  2     SAB      TAB      Ectopic      Multiple      Live Births               Home  Medications    Prior to Admission medications   Medication Sig Start Date End Date Taking? Authorizing Provider  cholecalciferol (VITAMIN D) 1000 UNITS tablet Take 2,000 Units by mouth daily.    Yes [provider]  Clindamycin-Benzoyl Per, Refr, gel Apply 1 application topically as directed.  06/30/19  Yes [provider]  hydrochlorothiazide (MICROZIDE) 12.5 MG capsule Take 1 capsule (12.5 mg total) by mouth daily. 07/16/19  Yes Fay Records, MD  lisinopril (PRINIVIL,ZESTRIL) 5 MG tablet Take 20 mg by mouth daily.  08/02/15  Yes [provider]  Multiple Vitamin (MULTI VITAMIN DAILY PO) Take by mouth.   Yes [provider]  TIROSINT 50 MCG CAPS TAKE 1 CAPSULE BEFORE BREAKFAST DAILY 12/19/19  Yes Philemon Kingdom, MD  MINOCYCLINE HCL ER PO Take by mouth.    [provider]  sulfamethoxazole-trimethoprim (BACTRIM DS) 800-160 MG tablet Take 1 tablet by mouth 2 (two) times daily for 3 days. 03/14/20 03/17/20  Volney American, PA-C  tamsulosin (FLOMAX) 0.4 MG CAPS capsule Take 1 capsule (0.4 mg total) by mouth daily. 03/14/20  Volney American, PA-C    Family History Family History  Problem Relation Age of Onset  . Hypertension Mother   . Lung cancer Mother   . Heart disease Father   . Cancer Father        Throat,Lymphoma  . Diabetes Maternal Grandmother   . Heart disease Maternal Grandfather     Social History Social History   Tobacco Use  . Smoking status: Never Smoker  . Smokeless tobacco: Never Used  Vaping Use  . Vaping Use: Never used  Substance Use Topics  . Alcohol use: Yes    Alcohol/week: 10.0 standard drinks    Types: 10 Standard drinks or equivalent per week  . Drug use: No     Allergies   Patient has no known allergies.   Review of Systems Review of Systems PER HPI   Physical Exam Triage Vital Signs ED Triage Vitals  Enc Vitals Group     BP 03/14/20 1107 131/87     Pulse Rate 03/14/20 1107 98      Resp 03/14/20 1107 18     Temp 03/14/20 1107 98.8 F (37.1 C)     Temp Source 03/14/20 1107 Oral     SpO2 03/14/20 1107 100 %     Weight 03/14/20 1103 139 lb 12.8 oz (63.4 kg)     Height 03/14/20 1103 5\' 6"  (1.676 m)     Head Circumference --      Peak Flow --      Pain Score 03/14/20 1102 2     Pain Loc --      Pain Edu? --      Excl. in [place]? --    No data found.  Updated Vital Signs BP 131/87 (BP Location: Left Arm)   Pulse 98   Temp 98.8 F (37.1 C) (Oral)   Resp 18   Ht 5\' 6"  (1.676 m)   Wt 139 lb 12.8 oz (63.4 kg)   SpO2 100%   BMI 22.56 kg/m   Visual Acuity Right Eye Distance:   Left Eye Distance:   Bilateral Distance:    Right Eye Near:   Left Eye Near:    Bilateral Near:     Physical Exam Vitals and nursing note reviewed.  Constitutional:      Appearance: Normal appearance. She is not ill-appearing.  HENT:     Head: Atraumatic.     Mouth/Throat:     Mouth: Mucous membranes are moist.     Pharynx: Oropharynx is clear.  Eyes:     Extraocular Movements: Extraocular movements intact.     Conjunctiva/sclera: Conjunctivae normal.  Cardiovascular:     Rate and Rhythm: Normal rate and regular rhythm.     Heart sounds: Normal heart sounds.  Pulmonary:     Effort: Pulmonary effort is normal.     Breath sounds: Normal breath sounds.  Abdominal:     General: Bowel sounds are normal. There is no distension.     Palpations: Abdomen is soft.     Tenderness: There is abdominal tenderness (RLQ extending laterally). There is right CVA tenderness. There is no left CVA tenderness or guarding.  Musculoskeletal:        General: Normal range of motion.     Cervical back: Normal range of motion and neck supple.  Skin:    General: Skin is warm and dry.  Neurological:     Mental Status: She is alert and oriented to person, place, and time.  Psychiatric:  Mood and Affect: Mood normal.        Thought Content: Thought content normal.        Judgment: Judgment  normal.     UC Treatments / Results  Labs (all labs ordered are listed, but only abnormal results are displayed) Labs Reviewed  POCT URINALYSIS DIPSTICK, ED / UC - Abnormal; Notable for the following components:      Result Value   Hgb urine dipstick SMALL (*)    Leukocytes,Ua SMALL (*)    All other components within normal limits    EKG   Radiology No results found.  Procedures Procedures (including critical care time)  Medications Ordered in UC Medications - No data to display  Initial Impression / Assessment and Plan / UC Course  I have reviewed the triage vital signs and the nursing notes.  Pertinent labs & imaging results that were available during my care of the patient were reviewed by me and considered in my medical decision making (see chart for details).     Right flank pain, UTI vs nephrolithiasis most highly suspected. Will start bactrim course given leukocytes present in urine though not very convincing for UTI and flomax in case stone. Declines pain medication. Discussed push fluids, OTC pain relievers, heat prn to area. F/u with PCP next week and go to ED if sxs worsen at any time.    Final Clinical Impressions(s) / UC Diagnoses   Final diagnoses:  Right flank pain  Lower urinary tract infectious disease   Discharge Instructions   None    ED Prescriptions    Medication Sig Dispense Auth. Provider   tamsulosin (FLOMAX) 0.4 MG CAPS capsule Take 1 capsule (0.4 mg total) by mouth daily. 14 capsule Volney American, [place]   sulfamethoxazole-trimethoprim (BACTRIM DS) 800-160 MG tablet Take 1 tablet by mouth 2 (two) times daily for 3 days. 6 tablet Volney American, [place]     PDMP not reviewed this encounter.   Volney American, [place] 03/14/20 1159

## 2020-03-14 NOTE — ED Triage Notes (Signed)
PT reports  Sx's of UTI started 2 days ago and Rt flank pain started last night. Pt denies any fevers. No previous Hx of Kidney stones. Pain now 2/10 while sitting and pain increases when walking to 8/10.

## 2020-04-10 ENCOUNTER — Other Ambulatory Visit: Payer: Self-pay | Admitting: Internal Medicine

## 2020-04-12 DIAGNOSIS — Z23 Encounter for immunization: Secondary | ICD-10-CM | POA: Diagnosis not present

## 2020-04-19 DIAGNOSIS — H43813 Vitreous degeneration, bilateral: Secondary | ICD-10-CM | POA: Diagnosis not present

## 2020-04-19 DIAGNOSIS — H35033 Hypertensive retinopathy, bilateral: Secondary | ICD-10-CM | POA: Diagnosis not present

## 2020-04-19 DIAGNOSIS — H35372 Puckering of macula, left eye: Secondary | ICD-10-CM | POA: Diagnosis not present

## 2020-04-19 DIAGNOSIS — H31091 Other chorioretinal scars, right eye: Secondary | ICD-10-CM | POA: Diagnosis not present

## 2020-06-18 ENCOUNTER — Other Ambulatory Visit: Payer: Self-pay | Admitting: Internal Medicine

## 2020-07-29 ENCOUNTER — Encounter: Payer: Self-pay | Admitting: Obstetrics & Gynecology

## 2020-07-29 ENCOUNTER — Ambulatory Visit: Payer: BC Managed Care – PPO | Admitting: Obstetrics & Gynecology

## 2020-07-29 ENCOUNTER — Other Ambulatory Visit: Payer: Self-pay

## 2020-07-29 VITALS — BP 124/80 | Ht 66.0 in | Wt 147.0 lb

## 2020-07-29 DIAGNOSIS — Z01419 Encounter for gynecological examination (general) (routine) without abnormal findings: Secondary | ICD-10-CM | POA: Diagnosis not present

## 2020-07-29 DIAGNOSIS — Z780 Asymptomatic menopausal state: Secondary | ICD-10-CM

## 2020-07-29 NOTE — Progress Notes (Signed)
Mary Hughes Nov 03, 1960 [id number]   History:    60 y.o. G2P2L2  RP:  Established patient presenting for annual gyn exam   HPI: Postmenopausal well on no HRT.  No PMB.  No pelvic pain.  Pelvic US 03/2019 showed a small solid avascular nodule on the Rt ovary which was stable c/w [place] in 2015.  No urinary symptoms such as frequency dysuria urgency.  No GI symptoms such as constipation diarrhea.  Breasts normal.  BMI 23.73.  [place] 2018, every 5 yrs.  Health labs with Fam MD.  Past medical history,surgical history, family history and social history were all reviewed and documented in the EPIC chart.  Gynecologic History No LMP recorded. Patient has had an ablation.  Obstetric History OB History  Gravida Para Term Preterm AB Living  2 2 2    0 2  SAB IAB Ectopic Multiple Live Births  0   0        # Outcome Date GA Lbr Len/2nd Weight Sex Delivery Anes PTL Lv  2 Term           1 Term              ROS: A ROS was performed and pertinent positives and negatives are included in the history.  GENERAL: No fevers or chills. HEENT: No change in vision, no earache, sore throat or sinus congestion. NECK: No pain or stiffness. CARDIOVASCULAR: No chest pain or pressure. No palpitations. PULMONARY: No shortness of breath, cough or wheeze. GASTROINTESTINAL: No abdominal pain, nausea, vomiting or diarrhea, melena or bright red blood per rectum. GENITOURINARY: No urinary frequency, urgency, hesitancy or dysuria. MUSCULOSKELETAL: No joint or muscle pain, no back pain, no recent trauma. DERMATOLOGIC: No rash, no itching, no lesions. ENDOCRINE: No polyuria, polydipsia, no heat or cold intolerance. No recent change in weight. HEMATOLOGICAL: No anemia or easy bruising or bleeding. NEUROLOGIC: No headache, seizures, numbness, tingling or weakness. PSYCHIATRIC: No depression, no loss of interest in normal activity or change in sleep pattern.     Exam:   BP 124/80 (BP Location: Right Arm, Patient Position:  Sitting, Cuff Size: Normal)   Ht 5\' 6"  (1.676 m)   Wt 147 lb (66.7 kg)   BMI 23.73 kg/m   Body mass index is 23.73 kg/m.  General appearance : Well developed well nourished female. No acute distress HEENT: Eyes: no retinal hemorrhage or exudates,  Neck supple, trachea midline, no carotid bruits, no thyroidmegaly Lungs: Clear to auscultation, no rhonchi or wheezes, or rib retractions  Heart: Regular rate and rhythm, no murmurs or gallops Breast:Examined in sitting and supine position were symmetrical in appearance, no palpable masses or tenderness,  no skin retraction, no nipple inversion, no nipple discharge, no skin discoloration, no axillary or supraclavicular lymphadenopathy Abdomen: no palpable masses or tenderness, no rebound or guarding Extremities: no edema or skin discoloration or tenderness  Pelvic: Vulva: Normal             Vagina: No gross lesions or discharge  Cervix: No gross lesions or discharge  Uterus  AV, normal size, shape and consistency, non-tender and mobile  Adnexa  Without masses or tenderness  Anus: Normal   Assessment/Plan:  60 y.o. female for annual exam   1. Well female exam with routine gynecological exam Normal gynecologic exam.  No indication to repeat a Pap test this year, will do every 3 years.  Breast exam normal.  Screening mammo neg 07/2019.  Colono 2018.  Health labs with  Fam MD.  BMI 23.73.  Continue with fitness and healthy nutrition.  2. Postmenopause Well on no HRT.  No PMB.  On Vit D supplements, will take a total of 1500 mg of Ca++ daily, regular weight bearing physical activities.  Princess Bruins MD, 8:49 AM 07/29/2020

## 2020-08-08 NOTE — Progress Notes (Signed)
Cardiology Office Note   Date:  08/09/2020   ID:  Mary Hughes, DOB August 27, 1960, MRN [id number]  PCP:  Gaynelle Arabian, MD  Cardiologist:   Dorris Carnes, MD   Pt presents for continued cardiac care of HTN and MV dz      History of Present Illness: Mary Hughes is a 60 y.o. female with a history of HTN  And mitral regurgitation  who presents for continued care   [facility] was previously followed by Thurman Coyer   The pt had an echo in 2018 that showed mild LVH and mild to mod MR    I last saw the pt in Jan 2021   Echo was done in Feb 2021  SInce seen she says she has done good   She is active   Breathing is OK  No CP  BP has been running 100s- 130s        Current Meds  Medication Sig  . cholecalciferol (VITAMIN D) 1000 UNITS tablet Take 2,000 Units by mouth daily.   . Clindamycin-Benzoyl Per, Refr, gel Apply 1 application topically as directed.   . hydrochlorothiazide (MICROZIDE) 12.5 MG capsule TAKE 1 CAPSULE BY MOUTH EVERY DAY  . lisinopril (PRINIVIL,ZESTRIL) 5 MG tablet Take 20 mg by mouth daily.   . Multiple Vitamin (MULTI VITAMIN DAILY PO) Take by mouth.  Serita Grammes 50 MCG CAPS TAKE 1 CAPSULE BEFORE BREAKFAST DAILY     Allergies:   Estradiol   Past Medical History:  Diagnosis Date  . Hypertension   . Ovarian mass 06/2005   RIGHT OVARIAN ECHOGENIC MASS  . S/P endometrial ablation 10/07  . Thyroid disease    Hypo thyroid    Past Surgical History:  Procedure Laterality Date  . COLONOSCOPY  04/03/2012   Procedure: COLONOSCOPY;  Surgeon: Wonda Horner, MD;  Location: [facility];  Service: Endoscopy;  Laterality: N/A;  . ENDOMETRIAL ABLATION  04/18/2006  . HIP SURGERY  08/2004   LEFT HIP REPLACEMENT  . HYSTEROSCOPY  04/18/2006   HYST, D&C WITH NOVASURE  . JOINT REPLACEMENT     left hip  . PILONIDAL CYST DRAINAGE     Removed     Social History:  The patient  reports that she has never smoked. She has never used smokeless tobacco. She reports current alcohol use  of about 10.0 standard drinks of alcohol per week. She reports that she does not use drugs.   Family History:  The patient's family history includes Cancer in her father; Diabetes in her maternal grandmother; Heart disease in her father and maternal grandfather; Hypertension in her mother; Lung cancer in her mother.    ROS:  Please see the history of present illness. All other systems are reviewed and  Negative to the above problem except as noted.    PHYSICAL EXAM: VS:  BP 132/86   Pulse 65   Ht 5\' 6"  (1.676 m)   Wt 153 lb (69.4 kg)   SpO2 97%   BMI 24.69 kg/m   GEN: Well nourished, well developed, in no acute distress  HEENT: normal  Neck: no JVD, carotid bruits  Cardiac: RRR; no murmurs,,no LE edema  Respiratory:  clear to auscultation bilaterally  GI: soft, nontender, nondistended, + BS  No hepatomegaly  MS: no deformity Moving all extremities   Skin: warm and dry, no rash Neuro:  Strength and sensation are intact Psych: euthymic mood, full affect   EKG:  EKG is ordered today.  SR 65  Echo   07/22/19  1. Left ventricular ejection fraction, by visual estimation, is 60 to 65%. The left ventricle has normal function. There is no left ventricular hypertrophy. 2. Global right ventricle has normal systolic function.The right ventricular size is normal. No increase in right ventricular wall thickness. 3. Left atrial size was normal. 4. Right atrial size was normal. 5. The mitral valve is abnormal. Mild mitral valve regurgitation. No evidence of mitral stenosis. Late systolic bowing without frank prolapse. 6. The tricuspid valve is normal in structure. 7. The tricuspid valve is normal in structure. Tricuspid valve regurgitation is trivial. 8. The aortic valve is normal in structure. Aortic valve regurgitation is not visualized. No evidence of aortic valve sclerosis or stenosis. 9. The pulmonic valve was normal in structure. Pulmonic valve regurgitation is trivial. 10. Normal  pulmonary artery systolic pressure. 11. The inferior vena cava is normal in size with greater than 50% respiratory variability, suggesting right atrial pressure of 3 mmHg. 12. Left ventricular diastolic parameters are consistent with Grade I diastolic dysfunction (impaired relaxation). 13. The left ventricle has no regional wall motion abnormalities.  Lipid Panel    Component Value Date/Time   CHOL 172 01/18/2015 0930   TRIG 60 01/18/2015 0930   HDL 73 01/18/2015 0930   CHOLHDL 2.4 01/18/2015 0930   VLDL 12 01/18/2015 0930   LDLCALC 87 01/18/2015 0930      Wt Readings from Last 3 Encounters:  08/09/20 153 lb (69.4 kg)  07/29/20 147 lb (66.7 kg)  03/14/20 139 lb 12.8 oz (63.4 kg)      ASSESSMENT AND PLAN:  1.  HTN   BP is good  WOuld keep on current regimen  2  MV disease. Echo last year showed buckling of MV with mild MR   No murmur on exam  3  HL    WIll check lipids today  4  Diet   Discussed TRE, low sugar, high fiber       Current medicines are reviewed at length with the patient today.  The patient does not have concerns regarding medicines.  Signed, Dorris Carnes, MD  08/09/2020 8:39 AM    [facility] [place], [place], [place]  [address] Phone: [phone]; Fax: [phone]

## 2020-08-09 ENCOUNTER — Ambulatory Visit: Payer: BC Managed Care – PPO | Admitting: Internal Medicine

## 2020-08-09 ENCOUNTER — Encounter: Payer: Self-pay | Admitting: Internal Medicine

## 2020-08-09 ENCOUNTER — Other Ambulatory Visit: Payer: Self-pay

## 2020-08-09 VITALS — BP 132/86 | HR 65 | Ht 66.0 in | Wt 153.0 lb

## 2020-08-09 DIAGNOSIS — I341 Nonrheumatic mitral (valve) prolapse: Secondary | ICD-10-CM | POA: Diagnosis not present

## 2020-08-09 DIAGNOSIS — E063 Autoimmune thyroiditis: Secondary | ICD-10-CM

## 2020-08-09 DIAGNOSIS — I10 Essential (primary) hypertension: Secondary | ICD-10-CM | POA: Diagnosis not present

## 2020-08-09 NOTE — Patient Instructions (Signed)
Medication Instructions:  Your physician recommends that you continue on your current medications as directed. Please refer to the Current Medication list given to you today.  *If you need a refill on your cardiac medications before your next appointment, please call your pharmacy*   Lab Work: Lab work to be done today--CBC, BMP, TSH, Vitamin D, Lipid profile If you have labs (blood work) drawn today and your tests are completely normal, you will receive your results only by: Marland Kitchen MyChart Message (if you have MyChart) OR . A paper copy in the mail If you have any lab test that is abnormal or we need to change your treatment, we will call you to review the results.   Testing/Procedures: none   Follow-Up: At [facility], you and your health needs are our priority.  As part of our continuing mission to provide you with exceptional heart care, we have created designated Provider Care Teams.  These Care Teams include your primary Cardiologist (physician) and Advanced Practice Providers (APPs -  Physician Assistants and Nurse Practitioners) who all work together to provide you with the care you need, when you need it.  We recommend signing up for the patient portal called "MyChart".  Sign up information is provided on this After Visit Summary.  MyChart is used to connect with patients for Virtual Visits (Telemedicine).  Patients are able to view lab/test results, encounter notes, upcoming appointments, etc.  Non-urgent messages can be sent to your provider as well.   To learn more about what you can do with MyChart, go to [email or web address].    Your next appointment:   12 month(s)  The format for your next appointment:   In Person  Provider:   You may see Dorris Carnes, MD or one of the following Advanced Practice Providers on your designated Care Team:    Richardson Dopp, PA-C  Robbie Lis, [place]    Other Instructions

## 2020-08-10 LAB — CBC
Hematocrit: 34.8 % (ref 34.0–46.6)
Hemoglobin: 12.4 g/dL (ref 11.1–15.9)
MCH: 32.8 pg (ref 26.6–33.0)
MCHC: 35.6 g/dL (ref 31.5–35.7)
MCV: 92 fL (ref 79–97)
Platelets: 275 10*3/uL (ref 150–450)
RBC: 3.78 x10E6/uL (ref 3.77–5.28)
RDW: 13 % (ref 11.7–15.4)
WBC: 6.2 10*3/uL (ref 3.4–10.8)

## 2020-08-10 LAB — VITAMIN D 25 HYDROXY (VIT D DEFICIENCY, FRACTURES): Vit D, 25-Hydroxy: 60.3 ng/mL (ref 30.0–100.0)

## 2020-08-10 LAB — BASIC METABOLIC PANEL
BUN/Creatinine Ratio: 19 (ref 9–23)
BUN: 13 mg/dL (ref 6–24)
CO2: 25 mmol/L (ref 20–29)
Calcium: 9.6 mg/dL (ref 8.7–10.2)
Chloride: 100 mmol/L (ref 96–106)
Creatinine, Ser: 0.7 mg/dL (ref 0.57–1.00)
GFR calc Af Amer: 111 mL/min/{1.73_m2} (ref >59)
GFR calc non Af Amer: 96 mL/min/{1.73_m2} (ref >59)
Glucose: 102 mg/dL — ABNORMAL HIGH (ref 65–99)
Potassium: 4 mmol/L (ref 3.5–5.2)
Sodium: 137 mmol/L (ref 134–144)

## 2020-08-10 LAB — LIPID PANEL
Chol/HDL Ratio: 2.4 ratio (ref 0.0–4.4)
Cholesterol, Total: 201 mg/dL — ABNORMAL HIGH (ref 100–199)
HDL: 85 mg/dL (ref >39)
LDL Chol Calc (NIH): 105 mg/dL — ABNORMAL HIGH (ref 0–99)
Triglycerides: 57 mg/dL (ref 0–149)
VLDL Cholesterol Cal: 11 mg/dL (ref 5–40)

## 2020-08-10 LAB — TSH: TSH: 2.3 u[IU]/mL (ref 0.450–4.500)

## 2020-08-18 DIAGNOSIS — D225 Melanocytic nevi of trunk: Secondary | ICD-10-CM | POA: Diagnosis not present

## 2020-08-18 DIAGNOSIS — L905 Scar conditions and fibrosis of skin: Secondary | ICD-10-CM | POA: Diagnosis not present

## 2020-08-18 DIAGNOSIS — D2262 Melanocytic nevi of left upper limb, including shoulder: Secondary | ICD-10-CM | POA: Diagnosis not present

## 2020-08-18 DIAGNOSIS — D2261 Melanocytic nevi of right upper limb, including shoulder: Secondary | ICD-10-CM | POA: Diagnosis not present

## 2020-09-08 ENCOUNTER — Other Ambulatory Visit: Payer: Self-pay | Admitting: Obstetrics & Gynecology

## 2020-09-08 ENCOUNTER — Other Ambulatory Visit: Payer: Self-pay

## 2020-09-08 ENCOUNTER — Ambulatory Visit: Payer: BC Managed Care – PPO | Admitting: Internal Medicine

## 2020-09-08 ENCOUNTER — Encounter: Payer: Self-pay | Admitting: Internal Medicine

## 2020-09-08 VITALS — BP 120/78 | HR 75 | Ht 66.0 in | Wt 150.2 lb

## 2020-09-08 DIAGNOSIS — E038 Other specified hypothyroidism: Secondary | ICD-10-CM

## 2020-09-08 DIAGNOSIS — E063 Autoimmune thyroiditis: Secondary | ICD-10-CM

## 2020-09-08 DIAGNOSIS — Z1231 Encounter for screening mammogram for malignant neoplasm of breast: Secondary | ICD-10-CM

## 2020-09-08 NOTE — Patient Instructions (Signed)
Please continue Tirosint 50 mcg daily.  Take the thyroid hormone every day, with water, at least 30 minutes before breakfast, separated by at least 4 hours from: - acid reflux medications - calcium - iron - multivitamins  Please come back for a follow-up appointment in 1 year.

## 2020-09-08 NOTE — Progress Notes (Signed)
Patient ID: Mary Hughes, female   DOB: May 09, 1961, 60 y.o.   MRN: [id number]  This visit occurred during the SARS-CoV-2 public health emergency.  Safety protocols were in place, including screening questions prior to the visit, additional usage of staff PPE, and extensive cleaning of exam room while observing appropriate contact time as indicated for disinfecting solutions.   HPI  Mary Hughes is a 60 y.o.-year-old female, initially referred by her ObGyn Dr., Dr Phineas Real, returning for f/u for Hashimoto's hypothyroidism. Last visit 1 year ago.  Interim history: She has done well since last visit, with no complaints at today's visit, other than still occasionally having burning tongue.  She did stop minocycline since last visit.  She has occasional blemishes, but no major changes in her skin after stopping the antibiotic.  Reviewed history: Pt. has been dx with Hashimoto's thyroiditis in 01/2015.  Retrospectively, she had a thyroid U/S in 2002 for enlarged thyroid >> aspect of the thyroid was heterogeneous >> thyroid inflammation has been present since then.  No thyroid nodules.  We started levothyroxine 10/2018.  She had tongue burning and tingling with generic levothyroxine >> switched to Tirosint 12/2018.  She takes Tirosint 50 mcg daily: - in am - fasting - at least 30 min from b'fast - no Ca, Fe, PPIs - + MVI at night - not on Biotin  Reviewed her TFTs: Lab Results  Component Value Date   TSH 2.300 08/09/2020   TSH 2.87 09/09/2019   TSH 2.27 12/13/2018   TSH 9.20 (H) 10/30/2018   TSH 6.20 (H) 09/09/2018   TSH 3.98 10/22/2017   TSH 4.70 (H) 09/07/2017   TSH 3.85 09/08/2016   TSH 4.43 03/16/2016   TSH 4.15 11/09/2015   FREET4 0.83 09/09/2019   FREET4 0.69 12/13/2018   FREET4 0.80 10/30/2018   FREET4 0.53 (L) 09/09/2018   FREET4 0.53 (L) 10/22/2017   FREET4 0.53 (L) 09/07/2017   FREET4 0.76 09/08/2016   FREET4 0.58 (L) 03/16/2016   FREET4 0.62 11/09/2015    FREET4 0.69 09/09/2015   T3FREE 3.3 09/09/2018   T3FREE 3.2 09/07/2017   T3FREE 3.4 09/08/2016   T3FREE 3.3 03/16/2016   T3FREE 2.9 11/09/2015   T3FREE 3.1 09/09/2015   T3FREE 3.2 03/12/2015   She had very high TPO antibodies: Component     Latest Ref Rng & Units 09/09/2019  Thyroperoxidase Ab SerPl-aCnc     <9 IU/mL >900 (H)   Component     Latest Ref Rng & Units 09/09/2018  Thyroperoxidase Ab SerPl-aCnc     <9 IU/mL >900 (H)   Component     Latest Ref Rng & Units 01/21/2015 09/09/2015 (on selenium) 09/08/2016 09/07/2017  Thyroperoxidase Ab SerPl-aCnc     <9 IU/mL >900 (H) >900 (H) >900 (H) >900 (H)   We tried selenium 200 mg daily 08/2016, but we stopped since her antibodies remain high.  Pt denies: - feeling nodules in neck - hoarseness - dysphagia - choking - SOB with lying down  She has + FH of thyroid disorders in: sister with Hashimoto's thyroiditis, + thyroid nodule in daughter.  No FH of thyroid cancer. No h/o radiation tx to head or neck. No herbal supplements. No Biotin use. No recent steroids use.   She also has HTN (dx 2016), GERD. She had cataracts removed  (hereditary cataracts).  At last visit she was on minocycline every other day for her skin -for many years.  Now off since last visit.  ROS: Constitutional: no  weight gain/no weight loss, no fatigue, no subjective hyperthermia, no subjective hypothermia Eyes: no blurry vision, no xerophthalmia ENT: no sore throat, + see HPI, + occasional burning tongue Cardiovascular: no CP/no SOB/no palpitations/no leg swelling Respiratory: no cough/no SOB/no wheezing Gastrointestinal: no N/no V/no D/no C/no acid reflux Musculoskeletal: no muscle aches/no joint aches Skin: no rashes, no hair loss Neurological: no tremors/no numbness/no tingling/no dizziness  I reviewed pt's medications, allergies, PMH, social hx, family hx, and changes were documented in the history of present illness. Otherwise, unchanged from my  initial visit note.  Past Medical History:  Diagnosis Date  . Hypertension   . Ovarian mass 06/2005   RIGHT OVARIAN ECHOGENIC MASS  . S/P endometrial ablation 10/07  . Thyroid disease    Hypo thyroid   Past Surgical History:  Procedure Laterality Date  . COLONOSCOPY  04/03/2012   Procedure: COLONOSCOPY;  Surgeon: Wonda Horner, MD;  Location: [facility];  Service: Endoscopy;  Laterality: N/A;  . ENDOMETRIAL ABLATION  04/18/2006  . HIP SURGERY  08/2004   LEFT HIP REPLACEMENT  . HYSTEROSCOPY  04/18/2006   HYST, D&C WITH NOVASURE  . JOINT REPLACEMENT     left hip  . PILONIDAL CYST DRAINAGE     Removed   Social History   Social History  . Marital Status: Married    Spouse Name: N/A  . Number of Children: 2   Occupational History  . COO at [employer]   Social History Main Topics  . Smoking status: Never Smoker   . Smokeless tobacco: Never Used  . Alcohol Use: 6.0 oz/week    Wine, beer - 5x a week, 2-3 Standard drinks or equivalent  . Drug Use: No  . Sexual Activity: Yes    Birth Control/ Protection: Other-see comments     Comment: Vasectomy-1st intercourse 60 yo-Fewer than 5 partners   Current Outpatient Medications on File Prior to Visit  Medication Sig Dispense Refill  . cholecalciferol (VITAMIN D) 1000 UNITS tablet Take 2,000 Units by mouth daily.     . Clindamycin-Benzoyl Per, Refr, gel Apply 1 application topically as directed.     . hydrochlorothiazide (MICROZIDE) 12.5 MG capsule TAKE 1 CAPSULE BY MOUTH EVERY DAY 90 capsule 0  . lisinopril (PRINIVIL,ZESTRIL) 5 MG tablet Take 20 mg by mouth daily.   3  . Multiple Vitamin (MULTI VITAMIN DAILY PO) Take by mouth.    . TIROSINT 50 MCG CAPS TAKE 1 CAPSULE BEFORE BREAKFAST DAILY 90 capsule 1   No current facility-administered medications on file prior to visit.   Allergies  Allergen Reactions  . Estradiol Other (See Comments)   Family History  Problem Relation Age of Onset  . Hypertension Mother   .  Lung cancer Mother   . Heart disease Father   . Cancer Father        Throat,Lymphoma  . Diabetes Maternal Grandmother   . Heart disease Maternal Grandfather    PE: BP 120/78 (BP Location: Right Arm, Patient Position: Sitting, Cuff Size: Normal)   Pulse 75   Ht 5\' 6"  (1.676 m)   Wt 150 lb 3.2 oz (68.1 kg)   SpO2 98%   BMI 24.24 kg/m  Body mass index is 24.24 kg/m. Wt Readings from Last 3 Encounters:  09/08/20 150 lb 3.2 oz (68.1 kg)  08/09/20 153 lb (69.4 kg)  07/29/20 147 lb (66.7 kg)   Constitutional: normal weight, in NAD Eyes: PERRLA, EOMI, no exophthalmos ENT: moist mucous membranes, no thyromegaly, no  cervical lymphadenopathy Cardiovascular: RRR, No MRG Respiratory: CTA B Gastrointestinal: abdomen soft, NT, ND, BS+ Musculoskeletal: no deformities, strength intact in all 4 Skin: moist, warm, no rashes Neurological: no tremor with outstretched hands, DTR normal in all 4  ASSESSMENT: 1. Hashimoto thyroiditis - Thyroid ultrasound (07/26/2000):  THE THYROID GLAND IS MILDLY HETEROGENEOUS WITHOUT DEFINITE FOCAL LESIONS OR CYSTS.   THE RIGHT LOBEOF THE THYROID MEASURES 1.1 X 1.5 X 4.5 CM   THE LEFT LOBE OF THE THYROID MEASURES 1.4 X 1.1 X 4.3 CM.   NO OTHER ABNORMALITIES ARE IDENTIFIED.   PLAN: 1. Hashimoto's hypothyroidism -Patient with previous history of euthyroid Hashimoto's thyroiditis, with very high TPO antibodies, not responding to selenium.  She is now off the supplement.  TSH started to increase afterwards and we started a low-dose levothyroxine.  She had burning and tingling of her tongue on levothyroxine p.o so we switched to [place] in 2020.  However, the burning tongue sensation continued.  She was on minocycline at last visit and reviewing the medication side effects, this may have been the cause for hypothyroidism and also her burning tongue.  She stopped the medication since last visit. - latest thyroid labs reviewed with pt >> normal: Lab Results   Component Value Date   TSH 2.300 08/09/2020   - she continues on LT4 50 mcg daily - pt feels good on this dose, without complaints - we discussed about taking the thyroid hormone every day, with water, >30 minutes before breakfast, separated by >4 hours from acid reflux medications, calcium, iron, multivitamins. Pt. is taking it correctly. - RTC in 1 year and will repeat her TFTs then   Philemon Kingdom, MD PhD [facility] Endocrinology

## 2020-09-09 ENCOUNTER — Other Ambulatory Visit: Payer: Self-pay | Admitting: Internal Medicine

## 2020-09-14 ENCOUNTER — Ambulatory Visit
Admission: RE | Admit: 2020-09-14 | Discharge: 2020-09-14 | Disposition: A | Discharge status: Home / Self Care | Payer: BC Managed Care – PPO | Source: Ambulatory Visit

## 2020-09-14 ENCOUNTER — Other Ambulatory Visit: Payer: Self-pay

## 2020-09-14 DIAGNOSIS — Z1231 Encounter for screening mammogram for malignant neoplasm of breast: Secondary | ICD-10-CM

## 2020-10-18 DIAGNOSIS — H43813 Vitreous degeneration, bilateral: Secondary | ICD-10-CM | POA: Diagnosis not present

## 2020-10-18 DIAGNOSIS — H35033 Hypertensive retinopathy, bilateral: Secondary | ICD-10-CM | POA: Diagnosis not present

## 2020-10-18 DIAGNOSIS — H31091 Other chorioretinal scars, right eye: Secondary | ICD-10-CM | POA: Diagnosis not present

## 2020-10-18 DIAGNOSIS — H35372 Puckering of macula, left eye: Secondary | ICD-10-CM | POA: Diagnosis not present

## 2020-11-17 ENCOUNTER — Encounter (INDEPENDENT_AMBULATORY_CARE_PROVIDER_SITE_OTHER): Payer: Self-pay

## 2020-11-17 DIAGNOSIS — H35371 Puckering of macula, right eye: Secondary | ICD-10-CM | POA: Diagnosis not present

## 2020-11-17 DIAGNOSIS — H5213 Myopia, bilateral: Secondary | ICD-10-CM | POA: Diagnosis not present

## 2020-11-17 DIAGNOSIS — H33302 Unspecified retinal break, left eye: Secondary | ICD-10-CM | POA: Diagnosis not present

## 2020-11-17 DIAGNOSIS — H40013 Open angle with borderline findings, low risk, bilateral: Secondary | ICD-10-CM | POA: Diagnosis not present

## 2020-11-18 ENCOUNTER — Other Ambulatory Visit: Payer: Self-pay

## 2020-11-18 ENCOUNTER — Ambulatory Visit (INDEPENDENT_AMBULATORY_CARE_PROVIDER_SITE_OTHER): Payer: BC Managed Care – PPO | Admitting: Ophthalmology

## 2020-11-18 ENCOUNTER — Encounter (INDEPENDENT_AMBULATORY_CARE_PROVIDER_SITE_OTHER): Payer: Self-pay | Admitting: Ophthalmology

## 2020-11-18 DIAGNOSIS — H43812 Vitreous degeneration, left eye: Secondary | ICD-10-CM

## 2020-11-18 DIAGNOSIS — H43312 Vitreous membranes and strands, left eye: Secondary | ICD-10-CM

## 2020-11-18 DIAGNOSIS — H35371 Puckering of macula, right eye: Secondary | ICD-10-CM | POA: Diagnosis not present

## 2020-11-18 DIAGNOSIS — H33311 Horseshoe tear of retina without detachment, right eye: Secondary | ICD-10-CM

## 2020-11-18 DIAGNOSIS — H40003 Preglaucoma, unspecified, bilateral: Secondary | ICD-10-CM | POA: Diagnosis not present

## 2020-11-18 DIAGNOSIS — H43811 Vitreous degeneration, right eye: Secondary | ICD-10-CM

## 2020-11-18 DIAGNOSIS — H43311 Vitreous membranes and strands, right eye: Secondary | ICD-10-CM | POA: Diagnosis not present

## 2020-11-18 NOTE — Assessment & Plan Note (Signed)
This is only a clinical diagnosis based upon the appearance of the optic nerves with some atrophy in the inferior pole left eye with coving of the vessels and enlargement cup-to-disc right show in each eye relative to normal as well as the patient had a strong family history on the maternal side for glaucoma.  Patient to follow-up completely with Dr. Luberta Mutter for evaluation and baseline studies for glaucoma management

## 2020-11-18 NOTE — Progress Notes (Signed)
11/18/2020     CHIEF COMPLAINT Patient presents for Retina Evaluation (NP- eval for vitrectomy for floaters- Referred by Dr. Yvetta Coder states, "I had a retinal tear in my OD in 05/2020 and I had laser for that. I have had floaters in my OD since but in March I started having floaters in my OS as well and they are bad. There are a lot and they do not go away. They are like long squiggly worms in both eyes.")   HISTORY OF PRESENT ILLNESS: Mary Hughes is a 60 y.o. female who presents to the clinic today for:   HPI    Retina Evaluation    Laterality: both eyes   Onset: 3 months ago   Duration: 3 months   Associated Symptoms: Floaters (Both Eyes, Many, OS>OD).  Negative for Flashes and Pain   Treatments tried: no treatments   Response to treatment: no improvement   Comments: NP- eval for vitrectomy for floaters- Referred by Dr. Eben Burow Pt states, "I had a retinal tear in my OD in 05/2020 and I had laser for that. I have had floaters in my OD since but in March I started having floaters in my OS as well and they are bad. There are a lot and they do not go away. They are like long squiggly worms in both eyes."       Last edited by Kendra Opitz, COA on 11/18/2020  9:02 AM. (History)      Referring physician: Gaynelle Arabian, MD [address],  [place] [address]  HISTORICAL INFORMATION:   Selected notes from the [place]: No current outpatient medications on file. (Ophthalmic Drugs)   No current facility-administered medications for this visit. (Ophthalmic Drugs)   Current Outpatient Medications (Other)  Medication Sig  . cholecalciferol (VITAMIN D) 1000 UNITS tablet Take 2,000 Units by mouth daily.   . Clindamycin-Benzoyl Per, Refr, gel Apply 1 application topically as directed.   . hydrochlorothiazide (MICROZIDE) 12.5 MG capsule TAKE 1 CAPSULE BY MOUTH EVERY DAY  . lisinopril (PRINIVIL,ZESTRIL) 5 MG tablet Take 20 mg  by mouth daily.   . Multiple Vitamin (MULTI VITAMIN DAILY PO) Take by mouth.  . TIROSINT 50 MCG CAPS TAKE 1 CAPSULE BEFORE BREAKFAST DAILY   No current facility-administered medications for this visit. (Other)      REVIEW OF SYSTEMS:    ALLERGIES Allergies  Allergen Reactions  . Estradiol Other (See Comments)    PAST MEDICAL HISTORY Past Medical History:  Diagnosis Date  . Hypertension   . Ovarian mass 06/2005   RIGHT OVARIAN ECHOGENIC MASS  . S/P endometrial ablation 10/07  . Thyroid disease    Hypo thyroid   Past Surgical History:  Procedure Laterality Date  . COLONOSCOPY  04/03/2012   Procedure: COLONOSCOPY;  Surgeon: Wonda Horner, MD;  Location: [facility];  Service: Endoscopy;  Laterality: N/A;  . ENDOMETRIAL ABLATION  04/18/2006  . HIP SURGERY  08/2004   LEFT HIP REPLACEMENT  . HYSTEROSCOPY  04/18/2006   HYST, D&C WITH NOVASURE  . JOINT REPLACEMENT     left hip  . PILONIDAL CYST DRAINAGE     Removed    FAMILY HISTORY Family History  Problem Relation Age of Onset  . Hypertension Mother   . Lung cancer Mother   . Heart disease Father   . Cancer Father        Throat,Lymphoma  . Diabetes  Maternal Grandmother   . Heart disease Maternal Grandfather     SOCIAL HISTORY Social History   Tobacco Use  . Smoking status: Never Smoker  . Smokeless tobacco: Never Used  Vaping Use  . Vaping Use: Never used  Substance Use Topics  . Alcohol use: Yes    Alcohol/week: 10.0 standard drinks    Types: 10 Standard drinks or equivalent per week  . Drug use: No         OPHTHALMIC EXAM:  Base Eye Exam    Visual Acuity (ETDRS)      Right Left   Dist [place] 20/20 20/20       Tonometry (Tonopen, 9:05 AM)      Right Left   Pressure 14 14       Pupils      Pupils Dark Light Shape React APD   Right PERRL 4 3 Round Brisk None   Left PERRL 4 3 Round Brisk None       Visual Fields (Counting fingers)      Left Right    Full Full       Extraocular  Movement      Right Left    Full Full       Dilation    Both eyes: 1.0% Mydriacyl, 2.5% Phenylephrine @ 9:05 AM        Slit Lamp and Fundus Exam    External Exam      Right Left   External Normal Normal       Slit Lamp Exam      Right Left   Lids/Lashes Normal Normal   Conjunctiva/Sclera White and quiet White and quiet   Cornea Clear Clear   Anterior Chamber Deep and quiet Deep and quiet   Iris Round and reactive Round and reactive   Lens Centered posterior chamber intraocular lens Centered posterior chamber intraocular lens   Anterior Vitreous Normal Normal       Fundus Exam      Right Left   Posterior Vitreous Posterior vitreous detachment, Central vitreous floaters Posterior vitreous detachment, Central vitreous floaters   Disc With coving of the vessels at the superior pole and inferior pole of nerve Coving of vessels at the nerve margin superior and inferior with slight atrophy inferiorly   C/D Ratio 0.65 0.7   Macula Epiretinal membrane, mild topographic change Normal   Vessels Normal Normal   Periphery Old retinal tear centered at 11:30 position superotemporally with excellent laser retinopexy no extension no subretinal fluid Normal          IMAGING AND PROCEDURES  Imaging and Procedures for 11/18/20  OCT, Retina - OU - Both Eyes       Right Eye Quality was good. Scan locations included subfoveal. Central Foveal Thickness: 360. Progression has no prior data. Findings include abnormal foveal contour, epiretinal membrane.   Left Eye Quality was good. Scan locations included subfoveal. Central Foveal Thickness: 279. Progression has no prior data. Findings include normal foveal contour.   Notes Moderate to severe retinal thickening in the macular region, from epiretinal membrane, no schisis cavities informed intraretinal.  OD.  More importantly there are no outer photoreceptor layer nodular changes from the induced thickening  OS normal       Color  Fundus Photography Optos - OU - Both Eyes       Right Eye Progression has no prior data. Disc findings include increased cup to disc ratio.   Left Eye Progression has no prior data.  Disc findings include increased cup to disc ratio.   Notes Bilateral vitreous membranes and strands, retinopexy OD stable                ASSESSMENT/PLAN:  Glaucoma suspect of both eyes This is only a clinical diagnosis based upon the appearance of the optic nerves with some atrophy in the inferior pole left eye with coving of the vessels and enlargement cup-to-disc right show in each eye relative to normal as well as the patient had a strong family history on the maternal side for glaucoma.  Patient to follow-up completely with Dr. Luberta Mutter for evaluation and baseline studies for glaucoma management  Vitreous membranes and strands, left eye The nature of vitreous floaters was discussed with the patient as well as their frequent occurrence with development of posterior vitreous detachment. The significance of flashes and field cuts was discussed with the patient. The need for a dilated fundus examination was discussed and advice given to return immediately for new or different floaters .  More uncommonly, vitreous floaters, debris, or clouds of haze may develop with impact on visual functioning.  If the personal impact to the patient is minor, observation usually results in diminishing symptoms.  If visual impact hampers activity of daily living over a period of time, medical interventions are available to change the condition, including laser vitreolysis or more commonly, surgical intervention to remove the visual impactful floaters.  OS with significant impairment of activities of daily living with driving and reading particularly these are constant and with central low, localized vitreous floaters despite being pseudophakic and having some significant period of time for these to clear they have  not.    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  Macular pucker, right eye Mild to moderate visual acuity impact on the right eye.  Retinal tear of right eye No new retinal breaks      ICD-10-CM   1. Macular pucker, right eye  H35.371 OCT, Retina - OU - Both Eyes  2. Posterior vitreous detachment, right eye  H43.811 OCT, Retina - OU - Both Eyes  3. Posterior vitreous detachment of left eye  H43.812 OCT, Retina - OU - Both Eyes  4. Glaucoma suspect of both eyes  H40.003   5. 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 Vitreous membranes and strands, left eye  H43.312 Color Fundus Photography Optos - OU - Both Eyes  7. Retinal tear of right eye  H33.311     1.  OD, with epiretinal membrane, with no impact on visual acuity to date yet most likely the minor impact from epiretinal membrane is made worse by the attendant and concomitant not related vitreous membrane strands and floaters impacting acuity in the right eye.  For the right eye, we had a long discussion about the potential merits of removing the epiretinal membrane at the time of vitrectomy should floaters be removed in the right eye, yet I do usually not remove it in asymptomatic patients.  Nonetheless the macular thickening is a substantial amount and we will look for change over time by OCT.  If the changes developing This with decision will be a lot easier to make  2.  OS, with more symptoms of vitreous floaters and debris centrally.  Patient now understands the benefits potentially of vitrectomy removal of vitreous debris and surveillance to look for signs of underlying retinal holes or tears at the time of surgery and subsequently.  He understands this can be undertaken when her activities of daily living  are affected in a profound fashion.  Risk and benefits reviewed at length  3.  OU, no new retinal holes or tears.  4.  I did share with the patient my concerns about potential for glaucoma suspect and potentially undergoing baseline testing with the office of Dr. Burr Medico particularly since patient has strong family history of glaucoma, as well as a history of myopia.  Patient will contact the office at any time if she would like to proceed with consideration of vitrectomy initially in the left eye.   Patient may call at any time to consider vitrectomy left eye initially-[id number], [facility]      ophthalmic Meds Ordered this visit:  No orders of the defined types were placed in this encounter.      Return in about 3 months (around 02/18/2021) for DILATE OU, OCT.  There are no Patient Instructions on file for this visit.   Explained the diagnoses, plan, and follow up with the patient and they expressed understanding.  Patient expressed understanding of the importance of proper follow up care.   Clent Demark Demetrie Borge M.D. Diseases & Surgery of the Retina and Vitreous Retina [facility] Diabetic [place] 11/18/20     Abbreviations: M myopia (nearsighted); A astigmatism; H hyperopia (farsighted); P presbyopia; Mrx spectacle prescription;  CTL contact lenses; OD right eye; OS left eye; OU both eyes  XT exotropia; ET esotropia; PEK punctate epithelial keratitis; PEE punctate epithelial erosions; DES dry eye syndrome; MGD meibomian gland dysfunction; ATs artificial tears; PFAT's preservative free artificial tears; [place] nuclear sclerotic cataract; PSC posterior subcapsular cataract; ERM epi-retinal membrane; PVD posterior vitreous detachment; RD retinal detachment; DM diabetes mellitus; DR diabetic retinopathy; NPDR non-proliferative diabetic retinopathy; PDR proliferative diabetic retinopathy; CSME clinically significant macular edema; DME diabetic macular edema; dbh dot blot  hemorrhages; CWS cotton wool spot; POAG primary open angle glaucoma; C/D cup-to-disc ratio; HVF humphrey visual field; GVF goldmann visual field; OCT optical coherence tomography; IOP intraocular pressure; BRVO Branch retinal vein occlusion; CRVO central retinal vein occlusion; CRAO central retinal artery occlusion; BRAO branch retinal artery occlusion; RT retinal tear; SB scleral buckle; PPV pars plana vitrectomy; VH Vitreous hemorrhage; PRP panretinal laser photocoagulation; IVK intravitreal kenalog; VMT vitreomacular traction; MH Macular hole;  NVD neovascularization of the disc; NVE neovascularization elsewhere; AREDS age related eye disease study; ARMD age related macular degeneration; POAG primary open angle glaucoma; EBMD epithelial/anterior basement membrane dystrophy; ACIOL anterior chamber intraocular lens; IOL intraocular lens; PCIOL posterior chamber intraocular lens; Phaco/IOL phacoemulsification with intraocular lens placement; [place] photorefractive keratectomy; LASIK laser assisted in situ keratomileusis; HTN hypertension; DM diabetes mellitus; COPD chronic obstructive pulmonary disease

## 2020-11-18 NOTE — Assessment & Plan Note (Signed)
No new retinal breaks 

## 2020-11-18 NOTE — Assessment & Plan Note (Signed)
Vitreous membranes and strands also affecting the central visual acuity in the right eye with a similar symptoms but to a slightly less extent in the left eye.

## 2020-11-18 NOTE — Assessment & Plan Note (Signed)
The nature of vitreous floaters was discussed with the patient as well as their frequent occurrence with development of posterior vitreous detachment. The significance of flashes and field cuts was discussed with the patient. The need for a dilated fundus examination was discussed and advice given to return immediately for new or different floaters .  More uncommonly, vitreous floaters, debris, or clouds of haze may develop with impact on visual functioning.  If the personal impact to the patient is minor, observation usually results in diminishing symptoms.  If visual impact hampers activity of daily living over a period of time, medical interventions are available to change the condition, including laser vitreolysis or more commonly, surgical intervention to remove the visual impactful floaters.  OS with significant impairment of activities of daily living with driving and reading particularly these are constant and with central low, localized vitreous floaters despite being pseudophakic and having some significant period of time for these to clear they have not.

## 2020-11-18 NOTE — Assessment & Plan Note (Signed)
Mild to moderate visual acuity impact on the right eye.

## 2020-12-13 ENCOUNTER — Other Ambulatory Visit: Payer: Self-pay | Admitting: Internal Medicine

## 2021-01-26 ENCOUNTER — Ambulatory Visit (INDEPENDENT_AMBULATORY_CARE_PROVIDER_SITE_OTHER): Payer: BC Managed Care – PPO

## 2021-02-04 DIAGNOSIS — M25561 Pain in right knee: Secondary | ICD-10-CM | POA: Diagnosis not present

## 2021-02-10 ENCOUNTER — Encounter (INDEPENDENT_AMBULATORY_CARE_PROVIDER_SITE_OTHER): Payer: BC Managed Care – PPO | Admitting: Ophthalmology

## 2021-02-15 DIAGNOSIS — Z23 Encounter for immunization: Secondary | ICD-10-CM | POA: Diagnosis not present

## 2021-02-15 DIAGNOSIS — E063 Autoimmune thyroiditis: Secondary | ICD-10-CM | POA: Diagnosis not present

## 2021-02-15 DIAGNOSIS — I10 Essential (primary) hypertension: Secondary | ICD-10-CM | POA: Diagnosis not present

## 2021-02-15 DIAGNOSIS — Z86010 Personal history of colonic polyps: Secondary | ICD-10-CM | POA: Diagnosis not present

## 2021-02-23 ENCOUNTER — Ambulatory Visit (INDEPENDENT_AMBULATORY_CARE_PROVIDER_SITE_OTHER): Payer: BC Managed Care – PPO

## 2021-02-23 ENCOUNTER — Encounter (INDEPENDENT_AMBULATORY_CARE_PROVIDER_SITE_OTHER): Payer: BC Managed Care – PPO | Admitting: Ophthalmology

## 2021-03-09 ENCOUNTER — Encounter (INDEPENDENT_AMBULATORY_CARE_PROVIDER_SITE_OTHER): Payer: Self-pay

## 2021-03-09 ENCOUNTER — Ambulatory Visit (INDEPENDENT_AMBULATORY_CARE_PROVIDER_SITE_OTHER): Payer: BC Managed Care – PPO

## 2021-03-09 ENCOUNTER — Other Ambulatory Visit: Payer: Self-pay

## 2021-03-09 DIAGNOSIS — H43312 Vitreous membranes and strands, left eye: Secondary | ICD-10-CM

## 2021-03-09 MED ORDER — PREDNISOLONE ACETATE 1 % OP SUSP: 1.0000 [drp] | Freq: Four times a day (QID) | OPHTHALMIC | 0 refills | Status: DC

## 2021-03-09 MED ORDER — OFLOXACIN 0.3 % OP SOLN: 1.0000 [drp] | Freq: Four times a day (QID) | OPHTHALMIC | 0 refills | Status: AC

## 2021-03-09 NOTE — Progress Notes (Signed)
03/09/2021     CHIEF COMPLAINT Patient presents for Pre-op Exam   HISTORY OF PRESENT ILLNESS: Mary Hughes is a 60 y.o. female who presents to the clinic today for:   HPI   Pre op OS sx 03/16/21. 3 months since last visit, no changes in vision. Denies any new floaters or FOL.   Last edited by Laurin Coder on 03/09/2021  3:47 PM.        HISTORICAL INFORMATION:   Selected notes from the MEDICAL RECORD NUMBER       CURRENT MEDICATIONS: Current Outpatient Medications (Ophthalmic Drugs)  Medication Sig   ofloxacin (OCUFLOX) 0.3 % ophthalmic solution Place 1 drop into the left eye in the morning, at noon, in the evening, and at bedtime for 21 doses.   prednisoLONE acetate (PRED FORTE) 1 % ophthalmic suspension Place 1 drop into the right eye 4 (four) times daily for 21 doses.   No current facility-administered medications for this visit. (Ophthalmic Drugs)   Current Outpatient Medications (Other)  Medication Sig   cholecalciferol (VITAMIN D) 1000 UNITS tablet Take 2,000 Units by mouth daily.    Clindamycin-Benzoyl Per, Refr, gel Apply 1 application topically as directed.    hydrochlorothiazide (MICROZIDE) 12.5 MG capsule TAKE 1 CAPSULE BY MOUTH EVERY DAY   lisinopril (PRINIVIL,ZESTRIL) 5 MG tablet Take 20 mg by mouth daily.    Multiple Vitamin (MULTI VITAMIN DAILY PO) Take by mouth.   TIROSINT 50 MCG CAPS TAKE 1 CAPSULE DAILY BY MOUTH BEFORE BREAKFAST   No current facility-administered medications for this visit. (Other)     ALLERGIES Allergies  Allergen Reactions   Estradiol Other (See Comments)    PAST MEDICAL HISTORY Past Medical History:  Diagnosis Date   Hypertension    Ovarian mass 06/2005   RIGHT OVARIAN ECHOGENIC MASS   S/P endometrial ablation 10/07   Thyroid disease    Hypo thyroid   Past Surgical History:  Procedure Laterality Date   COLONOSCOPY  04/03/2012   Procedure: COLONOSCOPY;  Surgeon: Wonda Horner, MD;  Location: [facility];   Service: Endoscopy;  Laterality: N/A;   ENDOMETRIAL ABLATION  04/18/2006   HIP SURGERY  08/2004   LEFT HIP REPLACEMENT   HYSTEROSCOPY  04/18/2006   HYST, D&C WITH NOVASURE   JOINT REPLACEMENT     left hip   PILONIDAL CYST DRAINAGE     Removed    FAMILY HISTORY Family History  Problem Relation Age of Onset   Hypertension Mother    Lung cancer Mother    Heart disease Father    Cancer Father        Throat,Lymphoma   Diabetes Maternal Grandmother    Heart disease Maternal Grandfather     SOCIAL HISTORY Social History   Tobacco Use   Smoking status: Never   Smokeless tobacco: Never  Vaping Use   Vaping Use: Never used  Substance Use Topics   Alcohol use: Yes    Alcohol/week: 10.0 standard drinks    Types: 10 Standard drinks or equivalent per week   Drug use: No         OPHTHALMIC EXAM:  Base Eye Exam     Visual Acuity (ETDRS)       Right Left   Dist [place] 20/20 -1 20/20         Tonometry (Tonopen, 3:44 PM)       Right Left   Pressure  19         Pupils  Pupils Dark Light APD   Right PERRL 6 4 None   Left PERRL 6 4 None         Visual Fields (Counting fingers)       Left Right    Full Full         Extraocular Movement       Right Left    Full Full         Neuro/Psych     Oriented x3: Yes   Mood/Affect: Normal         Dilation     Both eyes: NO Dilation @ 3:44 PM            IMAGING AND PROCEDURES  Imaging and Procedures for @TODAY @           ASSESSMENT/PLAN:  No diagnosis found.  Ophthalmic Meds Ordered this visit:  Meds ordered this encounter  Medications   prednisoLONE acetate (PRED FORTE) 1 % ophthalmic suspension    Sig: Place 1 drop into the right eye 4 (four) times daily for 21 doses.    Dispense:  10 mL    Refill:  0   ofloxacin (OCUFLOX) 0.3 % ophthalmic solution    Sig: Place 1 drop into the left eye in the morning, at noon, in the evening, and at bedtime for 21 doses.    Dispense:  5 mL     Refill:  0        Pre-op completed. Operative consent obtained with pre-op eye drops reviewed with [name] and sent via [facility] as needed. Post op instructions reviewed with patient and per patient all questions answered.  Laurin Coder

## 2021-03-16 ENCOUNTER — Encounter (AMBULATORY_SURGERY_CENTER): Payer: BC Managed Care – PPO | Admitting: Ophthalmology

## 2021-03-16 DIAGNOSIS — H43312 Vitreous membranes and strands, left eye: Secondary | ICD-10-CM | POA: Diagnosis not present

## 2021-03-17 ENCOUNTER — Other Ambulatory Visit: Payer: Self-pay

## 2021-03-17 ENCOUNTER — Ambulatory Visit (INDEPENDENT_AMBULATORY_CARE_PROVIDER_SITE_OTHER): Payer: BC Managed Care – PPO | Admitting: Ophthalmology

## 2021-03-17 ENCOUNTER — Encounter (INDEPENDENT_AMBULATORY_CARE_PROVIDER_SITE_OTHER): Payer: Self-pay | Admitting: Ophthalmology

## 2021-03-17 DIAGNOSIS — H43312 Vitreous membranes and strands, left eye: Secondary | ICD-10-CM

## 2021-03-17 NOTE — Progress Notes (Signed)
03/17/2021     CHIEF COMPLAINT Patient presents for  Chief Complaint  Patient presents with   Post-op Follow-up      HISTORY OF PRESENT ILLNESS: Mary Hughes is a 60 y.o. female who presents to the clinic today for:   HPI     Post-op Follow-up   In left eye.  Discomfort includes pain (Slight ache).  Negative for itching, foreign body sensation, tearing and discharge.  Vision is stable.        Comments   1 day PO OS sx 03/16/2021. Pt states "everything is bright yellow, could be because you just took the shield off." Last night patient took 3 tylenol before bed. Pt states she will start taking the prednisolone and ofloxacin four times a day again as of today.      Last edited by Laurin Coder on 03/17/2021  8:27 AM.      Referring physician: Gaynelle Arabian, MD [address],  [place] [address]  HISTORICAL INFORMATION:   Selected notes from the [facility]: No current outpatient medications on file. (Ophthalmic Drugs)   No current facility-administered medications for this visit. (Ophthalmic Drugs)   Current Outpatient Medications (Other)  Medication Sig   cholecalciferol (VITAMIN D) 1000 UNITS tablet Take 2,000 Units by mouth daily.    Clindamycin-Benzoyl Per, Refr, gel Apply 1 application topically as directed.    hydrochlorothiazide (MICROZIDE) 12.5 MG capsule TAKE 1 CAPSULE BY MOUTH EVERY DAY   lisinopril (PRINIVIL,ZESTRIL) 5 MG tablet Take 20 mg by mouth daily.    Multiple Vitamin (MULTI VITAMIN DAILY PO) Take by mouth.   TIROSINT 50 MCG CAPS TAKE 1 CAPSULE DAILY BY MOUTH BEFORE BREAKFAST   No current facility-administered medications for this visit. (Other)      REVIEW OF SYSTEMS:    ALLERGIES Allergies  Allergen Reactions   Estradiol Other (See Comments)    PAST MEDICAL HISTORY Past Medical History:  Diagnosis Date   Hypertension    Ovarian mass 06/2005   RIGHT OVARIAN  ECHOGENIC MASS   S/P endometrial ablation 10/07   Thyroid disease    Hypo thyroid   Past Surgical History:  Procedure Laterality Date   COLONOSCOPY  04/03/2012   Procedure: COLONOSCOPY;  Surgeon: Wonda Horner, MD;  Location: [facility];  Service: Endoscopy;  Laterality: N/A;   ENDOMETRIAL ABLATION  04/18/2006   HIP SURGERY  08/2004   LEFT HIP REPLACEMENT   HYSTEROSCOPY  04/18/2006   HYST, D&C WITH NOVASURE   JOINT REPLACEMENT     left hip   PILONIDAL CYST DRAINAGE     Removed    FAMILY HISTORY Family History  Problem Relation Age of Onset   Hypertension Mother    Lung cancer Mother    Heart disease Father    Cancer Father        Throat,Lymphoma   Diabetes Maternal Grandmother    Heart disease Maternal Grandfather     SOCIAL HISTORY Social History   Tobacco Use   Smoking status: Never   Smokeless tobacco: Never  Vaping Use   Vaping Use: Never used  Substance Use Topics   Alcohol use: Yes    Alcohol/week: 10.0 standard drinks    Types: 10 Standard drinks or equivalent per week   Drug use: No         OPHTHALMIC EXAM:  Base Eye Exam     Visual Acuity (ETDRS)  Right Left   Dist [place] 20/20 -1 20/20         Tonometry (Tonopen, 8:30 AM)       Right Left   Pressure 11 4         Pupils       Dark Light React APD   Right 4 3  None   Left [employer] Dilated  fixed None         Extraocular Movement       Right Left    Full Full         Neuro/Psych     Oriented x3: Yes   Mood/Affect: Normal         Dilation     Both eyes: 1.0% Mydriacyl, 2.5% Phenylephrine @ 8:30 AM           Slit Lamp and Fundus Exam     External Exam       Right Left   External Normal Normal         Slit Lamp Exam       Right Left   Lids/Lashes Normal Normal   Conjunctiva/Sclera White and quiet White and quiet   Cornea Clear Clear   Anterior Chamber Deep and quiet Deep and quiet   Iris Round and reactive Round and reactive   Lens Centered  posterior chamber intraocular lens Centered posterior chamber intraocular lens   Anterior Vitreous Normal clear, avitric         Fundus Exam       Right Left   Posterior Vitreous  clear avitric, no vitreous debris, no strands remain   Disc  Coving of vessels at the nerve margin superior and inferior with slight atrophy inferiorly   C/D Ratio  0.7   Macula  Normal   Vessels  Normal   Periphery  Normal            IMAGING AND PROCEDURES  Imaging and Procedures for 03/17/21           ASSESSMENT/PLAN:  Vitreous membranes and strands, left eye Postop day #1, patient to commence with topical eyedrops again left eye she is to use these for the next 3 weeks, and do not refill if they run out prior to that time.  And if there are any leftover at that time she is to discontinue their use     ICD-10-CM   1. Vitreous membranes and strands, left eye  H43.312       1.  2.  3.  Ophthalmic Meds Ordered this visit:  No orders of the defined types were placed in this encounter.      Return in about 11 days (around 03/28/2021) for POST OP, COLOR FP, OCT, OS.  Patient Instructions  I explained to the patient that by law you only need one eye to drive however, depth perception is significantly affected after eye surgery and we do not recommend driving a motor vehicle until the patient adapts to this change. I explained to the patient that by law you only need one eye to drive however, depth perception is significantly affected after eye surgery and we do not recommend driving a motor vehicle until the patient adapts to this change.I explained to the patient that by law you only need one eye to drive however, depth perception is significantly affected after eye surgery and we do not recommend driving a motor vehicle until the patient adapts to this change.  Ofloxacin  4 times daily to the operative eye  Prednisolone acetate 1 drop to the operative eye 4 times daily  Patient  instructed not to refill the medications and use them for maximum of 3 weeks.  Patient instructed do not rub the eye.  Patient has the option to use the patch at night.  No lifting and bending for 1 week. No water IN the eye for 10 days. Do not rub the eye. Wear shield at night for 1-3 days.  Continue your topical medications for a total of 3 weeks.  Do not refill your postoperative medications unless instructed.  Refrain from exercise or intentional activity which increases our heart rate above resting levels.  Normal walking to complete normal activities of your day are appropriate.  Driving:  Legally, you only need one good eye, of 20/40 or better to drive.  However, the practice does not recommend driving during first weeks after surgery, IF you are uncomfortable with your visual functioning or capabilities.   If you have known sleep apnea, wear your CPAP as you normally should.     Explained the diagnoses, plan, and follow up with the patient and they expressed understanding.  Patient expressed understanding of the importance of proper follow up care.   Clent Demark Alynn Ellithorpe M.D. Diseases & Surgery of the Retina and Vitreous Retina & Diabetic [place] 03/17/21     Abbreviations: M myopia (nearsighted); A astigmatism; H hyperopia (farsighted); P presbyopia; Mrx spectacle prescription;  CTL contact lenses; OD right eye; OS left eye; OU both eyes  XT exotropia; ET esotropia; PEK punctate epithelial keratitis; PEE punctate epithelial erosions; DES dry eye syndrome; MGD meibomian gland dysfunction; ATs artificial tears; PFAT's preservative free artificial tears; [place] nuclear sclerotic cataract; PSC posterior subcapsular cataract; ERM epi-retinal membrane; PVD posterior vitreous detachment; RD retinal detachment; DM diabetes mellitus; DR diabetic retinopathy; NPDR non-proliferative diabetic retinopathy; PDR proliferative diabetic retinopathy; CSME clinically significant macular edema; DME diabetic  macular edema; dbh dot blot hemorrhages; CWS cotton wool spot; POAG primary open angle glaucoma; C/D cup-to-disc ratio; HVF humphrey visual field; GVF goldmann visual field; OCT optical coherence tomography; IOP intraocular pressure; BRVO Branch retinal vein occlusion; CRVO central retinal vein occlusion; CRAO central retinal artery occlusion; BRAO branch retinal artery occlusion; RT retinal tear; SB scleral buckle; PPV pars plana vitrectomy; VH Vitreous hemorrhage; PRP panretinal laser photocoagulation; IVK intravitreal kenalog; VMT vitreomacular traction; MH Macular hole;  NVD neovascularization of the disc; NVE neovascularization elsewhere; AREDS age related eye disease study; ARMD age related macular degeneration; POAG primary open angle glaucoma; EBMD epithelial/anterior basement membrane dystrophy; ACIOL anterior chamber intraocular lens; IOL intraocular lens; PCIOL posterior chamber intraocular lens; Phaco/IOL phacoemulsification with intraocular lens placement; [place] photorefractive keratectomy; LASIK laser assisted in situ keratomileusis; HTN hypertension; DM diabetes mellitus; COPD chronic obstructive pulmonary disease

## 2021-03-17 NOTE — Assessment & Plan Note (Signed)
Postop day #1, patient to commence with topical eyedrops again left eye she is to use these for the next 3 weeks, and do not refill if they run out prior to that time.  And if there are any leftover at that time she is to discontinue their use

## 2021-03-17 NOTE — Patient Instructions (Signed)
I explained to the patient that by law you only need one eye to drive however, depth perception is significantly affected after eye surgery and we do not recommend driving a motor vehicle until the patient adapts to this change. I explained to the patient that by law you only need one eye to drive however, depth perception is significantly affected after eye surgery and we do not recommend driving a motor vehicle until the patient adapts to this change.I explained to the patient that by law you only need one eye to drive however, depth perception is significantly affected after eye surgery and we do not recommend driving a motor vehicle until the patient adapts to this change.  Ofloxacin  4 times daily to the operative eye  Prednisolone acetate 1 drop to the operative eye 4 times daily  Patient instructed not to refill the medications and use them for maximum of 3 weeks.  Patient instructed do not rub the eye.  Patient has the option to use the patch at night.  No lifting and bending for 1 week. No water IN the eye for 10 days. Do not rub the eye. Wear shield at night for 1-3 days.  Continue your topical medications for a total of 3 weeks.  Do not refill your postoperative medications unless instructed.  Refrain from exercise or intentional activity which increases our heart rate above resting levels.  Normal walking to complete normal activities of your day are appropriate.  Driving:  Legally, you only need one good eye, of 20/40 or better to drive.  However, the practice does not recommend driving during first weeks after surgery, IF you are uncomfortable with your visual functioning or capabilities.   If you have known sleep apnea, wear your CPAP as you normally should.

## 2021-03-28 ENCOUNTER — Encounter (INDEPENDENT_AMBULATORY_CARE_PROVIDER_SITE_OTHER): Payer: Self-pay | Admitting: Ophthalmology

## 2021-03-28 ENCOUNTER — Ambulatory Visit (INDEPENDENT_AMBULATORY_CARE_PROVIDER_SITE_OTHER): Payer: BC Managed Care – PPO | Admitting: Ophthalmology

## 2021-03-28 ENCOUNTER — Other Ambulatory Visit: Payer: Self-pay

## 2021-03-28 DIAGNOSIS — H43311 Vitreous membranes and strands, right eye: Secondary | ICD-10-CM

## 2021-03-28 DIAGNOSIS — H43312 Vitreous membranes and strands, left eye: Secondary | ICD-10-CM

## 2021-03-28 DIAGNOSIS — H35371 Puckering of macula, right eye: Secondary | ICD-10-CM

## 2021-03-28 NOTE — Assessment & Plan Note (Signed)
No change over the short interval of time

## 2021-03-28 NOTE — Assessment & Plan Note (Signed)
Postop vitrectomy now at 11 days.  Looks great, symptomatically improved.  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  OS we will taper her topical Pred forte in the left eye to twice daily concomitant with the other antibiotic eyedrop to use it also twice daily only for the next 10 days and then cease their use even if there are medications in the bottle at that time.  Patient is released to full activity

## 2021-03-28 NOTE — Assessment & Plan Note (Signed)
OD Minor in the past

## 2021-03-28 NOTE — Progress Notes (Signed)
03/28/2021     CHIEF COMPLAINT Patient presents for  Chief Complaint  Patient presents with   Post-op Follow-up      HISTORY OF PRESENT ILLNESS: Mary Hughes is a 60 y.o. female who presents to the clinic today for:   HPI     Post-op Follow-up   In left eye.  Discomfort includes Negative for pain (Slight ache), itching, foreign body sensation, tearing and discharge.  Vision is stable.        Comments   11 day PO OS sx 03/16/2021. Pt states vision is good, no changes. Denies new FOL and floaters. Pt is using prednisolone and ofloxacin OS qid.      Last edited by Laurin Coder on 03/28/2021  8:39 AM.      Referring physician: Gaynelle Arabian, MD [address],  [place] [address]  HISTORICAL INFORMATION:   Selected notes from the [facility]: No current outpatient medications on file. (Ophthalmic Drugs)   No current facility-administered medications for this visit. (Ophthalmic Drugs)   Current Outpatient Medications (Other)  Medication Sig   cholecalciferol (VITAMIN D) 1000 UNITS tablet Take 2,000 Units by mouth daily.    Clindamycin-Benzoyl Per, Refr, gel Apply 1 application topically as directed.    hydrochlorothiazide (MICROZIDE) 12.5 MG capsule TAKE 1 CAPSULE BY MOUTH EVERY DAY   lisinopril (PRINIVIL,ZESTRIL) 5 MG tablet Take 20 mg by mouth daily.    Multiple Vitamin (MULTI VITAMIN DAILY PO) Take by mouth.   TIROSINT 50 MCG CAPS TAKE 1 CAPSULE DAILY BY MOUTH BEFORE BREAKFAST   No current facility-administered medications for this visit. (Other)      REVIEW OF SYSTEMS:    ALLERGIES Allergies  Allergen Reactions   Estradiol Other (See Comments)    PAST MEDICAL HISTORY Past Medical History:  Diagnosis Date   Hypertension    Ovarian mass 06/2005   RIGHT OVARIAN ECHOGENIC MASS   S/P endometrial ablation 10/07   Thyroid disease    Hypo thyroid   Past Surgical History:   Procedure Laterality Date   COLONOSCOPY  04/03/2012   Procedure: COLONOSCOPY;  Surgeon: Wonda Horner, MD;  Location: [facility];  Service: Endoscopy;  Laterality: N/A;   ENDOMETRIAL ABLATION  04/18/2006   HIP SURGERY  08/2004   LEFT HIP REPLACEMENT   HYSTEROSCOPY  04/18/2006   HYST, D&C WITH NOVASURE   JOINT REPLACEMENT     left hip   PILONIDAL CYST DRAINAGE     Removed    FAMILY HISTORY Family History  Problem Relation Age of Onset   Hypertension Mother    Lung cancer Mother    Heart disease Father    Cancer Father        Throat,Lymphoma   Diabetes Maternal Grandmother    Heart disease Maternal Grandfather     SOCIAL HISTORY Social History   Tobacco Use   Smoking status: Never   Smokeless tobacco: Never  Vaping Use   Vaping Use: Never used  Substance Use Topics   Alcohol use: Yes    Alcohol/week: 10.0 standard drinks    Types: 10 Standard drinks or equivalent per week   Drug use: No         OPHTHALMIC EXAM:  Base Eye Exam     Visual Acuity (ETDRS)       Right Left   Dist [place] 20/20 20/20         Tonometry (Tonopen,  8:34 AM)       Right Left   Pressure 12 12         Pupils       Pupils Dark Light Shape React APD   Right PERRL 4 3 Round Brisk None   Left PERRL 4 3 Round Brisk None         Visual Fields (Counting fingers)       Left Right    Full Full         Extraocular Movement       Right Left    Full Full         Neuro/Psych     Oriented x3: Yes   Mood/Affect: Normal         Dilation     Left eye: 1.0% Mydriacyl, 2.5% Phenylephrine @ 8:36 AM           Slit Lamp and Fundus Exam     External Exam       Right Left   External Normal Normal         Slit Lamp Exam       Right Left   Lids/Lashes Normal Normal   Conjunctiva/Sclera White and quiet White and quiet   Cornea Clear Clear   Anterior Chamber Deep and quiet Deep and quiet   Iris Round and reactive Round and reactive   Lens Centered  posterior chamber intraocular lens Centered posterior chamber intraocular lens   Anterior Vitreous Normal clear, avitric         Fundus Exam       Right Left   Posterior Vitreous  clear avitric, no vitreous debris, no strands remain   Disc  Coving of vessels at the nerve margin superior and inferior with slight atrophy inferiorly   C/D Ratio  0.7   Macula  Normal   Vessels  Normal   Periphery  Normal            IMAGING AND PROCEDURES  Imaging and Procedures for 03/28/21           ASSESSMENT/PLAN:  Vitreous membranes and strands, left eye Postop vitrectomy now at 11 days.  Looks great, symptomatically improved.  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  OS we will taper her topical Pred forte in the left eye to twice daily concomitant with the other antibiotic eyedrop to use it also twice daily only for the next 10 days and then cease their use even if there are medications in the bottle at that time.  Patient is released to full activity  Vitreous membranes and strands of right eye OD Minor in the past  Macular pucker, right eye No change over the short interval of time     ICD-10-CM   1. Vitreous membranes and strands, left eye  H43.312     2. Vitreous membranes and strands of right eye  H43.311     3. Macular pucker, right eye  H35.371       1.  OS with vastly improved symptoms.    2.  OD with persistent floaters and some now reading impairment slightly worse OD as compared to OS that she relates not to floaters but to just quality.  Of asked the patient to continue to monocular tester vision with reading vision and if the right eye should have a subtle decline in quality contrast  or other symptomatology that would be secondary to the epiretinal membrane, we could then proceed with vitrectomy not only to remove the  vitreous strands and floaters and membranes in the right eye but also the epiretinal membrane at the same time OD  3.  Patient requested to contact office promptly should new difficulties develop in either eye  #4.  Patient instructed to complete the medications topically left eye with a next 10 days and cease their use thereafter.  Ophthalmic Meds Ordered this visit:  No orders of the defined types were placed in this encounter.      Return in about 4 months (around 07/29/2021) for DILATE OU, OCT.  There are no Patient Instructions on file for this visit.   Explained the diagnoses, plan, and follow up with the patient and they expressed understanding.  Patient expressed understanding of the importance of proper follow up care.   Clent Demark Aleah Ahlgrim M.D. Diseases & Surgery of the Retina and Vitreous Retina & Diabetic [place] 03/28/21     Abbreviations: M myopia (nearsighted); A astigmatism; H hyperopia (farsighted); P presbyopia; Mrx spectacle prescription;  CTL contact lenses; OD right eye; OS left eye; OU both eyes  XT exotropia; ET esotropia; PEK punctate epithelial keratitis; PEE punctate epithelial erosions; DES dry eye syndrome; MGD meibomian gland dysfunction; ATs artificial tears; PFAT's preservative free artificial tears; [place] nuclear sclerotic cataract; PSC posterior subcapsular cataract; ERM epi-retinal membrane; PVD posterior vitreous detachment; RD retinal detachment; DM diabetes mellitus; DR diabetic retinopathy; NPDR non-proliferative diabetic retinopathy; PDR proliferative diabetic retinopathy; CSME clinically significant macular edema; DME diabetic macular edema; dbh dot blot hemorrhages; CWS cotton wool spot; POAG primary open angle glaucoma; C/D cup-to-disc ratio; HVF humphrey visual field; GVF goldmann visual field; OCT optical coherence tomography; IOP intraocular pressure; BRVO Branch retinal vein occlusion; CRVO central retinal vein occlusion; CRAO central retinal  artery occlusion; BRAO branch retinal artery occlusion; RT retinal tear; SB scleral buckle; PPV pars plana vitrectomy; VH Vitreous hemorrhage; PRP panretinal laser photocoagulation; IVK intravitreal kenalog; VMT vitreomacular traction; MH Macular hole;  NVD neovascularization of the disc; NVE neovascularization elsewhere; AREDS age related eye disease study; ARMD age related macular degeneration; POAG primary open angle glaucoma; EBMD epithelial/anterior basement membrane dystrophy; ACIOL anterior chamber intraocular lens; IOL intraocular lens; PCIOL posterior chamber intraocular lens; Phaco/IOL phacoemulsification with intraocular lens placement; [place] photorefractive keratectomy; LASIK laser assisted in situ keratomileusis; HTN hypertension; DM diabetes mellitus; COPD chronic obstructive pulmonary disease

## 2021-04-28 MED ORDER — PREDNISOLONE ACETATE 1 % OP SUSP: 1.0000 [drp] | Freq: Four times a day (QID) | OPHTHALMIC | 0 refills | Status: AC

## 2021-04-28 NOTE — Addendum Note (Signed)
Addended by: Laurin Coder on: 04/28/2021 08:06 AM   Modules accepted: Orders

## 2021-05-19 DIAGNOSIS — R109 Unspecified abdominal pain: Secondary | ICD-10-CM | POA: Diagnosis not present

## 2021-06-08 ENCOUNTER — Other Ambulatory Visit: Payer: Self-pay | Admitting: Internal Medicine

## 2021-06-08 DIAGNOSIS — M25561 Pain in right knee: Secondary | ICD-10-CM | POA: Diagnosis not present

## 2021-07-08 DIAGNOSIS — M25561 Pain in right knee: Secondary | ICD-10-CM | POA: Diagnosis not present

## 2021-08-01 ENCOUNTER — Encounter (INDEPENDENT_AMBULATORY_CARE_PROVIDER_SITE_OTHER): Payer: BC Managed Care – PPO | Admitting: Ophthalmology

## 2021-08-02 DIAGNOSIS — Z713 Dietary counseling and surveillance: Secondary | ICD-10-CM | POA: Diagnosis not present

## 2021-08-08 ENCOUNTER — Other Ambulatory Visit: Payer: Self-pay

## 2021-08-08 ENCOUNTER — Encounter (INDEPENDENT_AMBULATORY_CARE_PROVIDER_SITE_OTHER): Payer: Self-pay | Admitting: Ophthalmology

## 2021-08-08 ENCOUNTER — Ambulatory Visit (INDEPENDENT_AMBULATORY_CARE_PROVIDER_SITE_OTHER): Payer: BC Managed Care – PPO | Admitting: Ophthalmology

## 2021-08-08 DIAGNOSIS — H43311 Vitreous membranes and strands, right eye: Secondary | ICD-10-CM

## 2021-08-08 DIAGNOSIS — H43813 Vitreous degeneration, bilateral: Secondary | ICD-10-CM | POA: Diagnosis not present

## 2021-08-08 DIAGNOSIS — H43811 Vitreous degeneration, right eye: Secondary | ICD-10-CM | POA: Diagnosis not present

## 2021-08-08 DIAGNOSIS — H43312 Vitreous membranes and strands, left eye: Secondary | ICD-10-CM | POA: Diagnosis not present

## 2021-08-08 DIAGNOSIS — H43313 Vitreous membranes and strands, bilateral: Secondary | ICD-10-CM

## 2021-08-08 DIAGNOSIS — H43812 Vitreous degeneration, left eye: Secondary | ICD-10-CM | POA: Diagnosis not present

## 2021-08-08 DIAGNOSIS — H35371 Puckering of macula, right eye: Secondary | ICD-10-CM

## 2021-08-08 NOTE — Assessment & Plan Note (Addendum)
OD with persistent symptoms, very annoying super annoying in fact in certain lighting conditions.  Really would like the right eye to be done as well, will consider vitrectomy associated also with epiretinal membrane peel at her return from travel in April 2023

## 2021-08-08 NOTE — Assessment & Plan Note (Signed)
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  Pictures reviewed with the patient.

## 2021-08-08 NOTE — Assessment & Plan Note (Signed)
Condition resolved OS 

## 2021-08-08 NOTE — Progress Notes (Signed)
08/08/2021     CHIEF COMPLAINT Patient presents for  Chief Complaint  Patient presents with   Retina Evaluation      HISTORY OF PRESENT ILLNESS: Mary Hughes is a 61 y.o. 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  "I want the floaters out of the right eye", super annoying         Last edited by Hurman Horn, MD on 08/08/2021  9:59 AM.      Referring physician: Gaynelle Arabian, MD [address],  [place] [address]  HISTORICAL INFORMATION:   Selected notes from the [facility]: No current outpatient medications on file. (Ophthalmic Drugs)   No current facility-administered medications for this visit. (Ophthalmic Drugs)   Current Outpatient Medications (Other)  Medication Sig   cholecalciferol (VITAMIN D) 1000 UNITS tablet Take 2,000 Units by mouth daily.    Clindamycin-Benzoyl Per, Refr, gel Apply 1 application topically as directed.    hydrochlorothiazide (MICROZIDE) 12.5 MG capsule TAKE 1 CAPSULE BY MOUTH EVERY DAY   lisinopril (PRINIVIL,ZESTRIL) 5 MG tablet Take 20 mg by mouth daily.    Multiple Vitamin (MULTI VITAMIN DAILY PO) Take by mouth.   TIROSINT 50 MCG CAPS TAKE 1 CAPSULE DAILY BY MOUTH BEFORE BREAKFAST   No current facility-administered medications for this visit. (Other)      REVIEW OF SYSTEMS: ROS   Negative for: Constitutional, Gastrointestinal, Neurological, Skin, Genitourinary, Musculoskeletal, HENT, Endocrine, Cardiovascular, Eyes, Respiratory, Psychiatric, Allergic/Imm, Heme/Lymph Last edited by Hurman Horn, MD on 08/08/2021  9:59 AM.       ALLERGIES Allergies  Allergen Reactions   Estradiol Other (See Comments)    PAST MEDICAL HISTORY Past Medical History:   Diagnosis Date   Hypertension    Ovarian mass 06/2005   RIGHT OVARIAN ECHOGENIC MASS   S/P endometrial ablation 10/07   Thyroid disease    Hypo thyroid   Past Surgical History:  Procedure Laterality Date   COLONOSCOPY  04/03/2012   Procedure: COLONOSCOPY;  Surgeon: Wonda Horner, MD;  Location: [facility];  Service: Endoscopy;  Laterality: N/A;   ENDOMETRIAL ABLATION  04/18/2006   HIP SURGERY  08/2004   LEFT HIP REPLACEMENT   HYSTEROSCOPY  04/18/2006   HYST, D&C WITH NOVASURE   JOINT REPLACEMENT     left hip   PILONIDAL CYST DRAINAGE     Removed    FAMILY HISTORY Family History  Problem Relation Age of Onset   Hypertension Mother    Lung cancer Mother    Heart disease Father    Cancer Father        Throat,Lymphoma   Diabetes Maternal Grandmother    Heart disease Maternal Grandfather     SOCIAL HISTORY Social History   Tobacco Use   Smoking status: Never   Smokeless tobacco: Never  Vaping Use   Vaping Use: Never used  Substance Use Topics   Alcohol use: Yes    Alcohol/week: 10.0 standard drinks    Types: 10 Standard drinks or equivalent per week   Drug use: No         OPHTHALMIC EXAM:  Base Eye  Exam     Visual Acuity (ETDRS)       Right Left   [place] 20/20 -2 20/20 -1         Tonometry (Tonopen, 9:59 AM)       Right Left   Pressure 6 13         Pupils       Pupils APD   Right PERRL None   Left PERRL None         Visual Fields       Left Right    Full Full         Extraocular Movement       Right Left    Full, Ortho Full, Ortho         Neuro/Psych     Oriented x3: Yes   Mood/Affect: Normal           Slit Lamp and Fundus Exam     External Exam       Right Left   External Normal Normal         Slit Lamp Exam       Right Left   Lids/Lashes Normal Normal   Conjunctiva/Sclera White and quiet White and quiet   Cornea Clear Clear   Anterior Chamber Deep and quiet Deep and quiet   Iris Round and  reactive Round and reactive   Lens Centered posterior chamber intraocular lens Centered posterior chamber intraocular lens   Anterior Vitreous Normal clear, avitric         Fundus Exam       Right Left   Posterior Vitreous Posterior vitreous detachment, Central vitreous floaters clear avitric, no vitreous debris, no strands remain   Disc With coving of the vessels at the superior pole and inferior pole of nerve Coving of vessels at the nerve margin superior and inferior with slight atrophy inferiorly   C/D Ratio 0.65 0.7   Macula Epiretinal membrane, moderate topographic change Normal   Vessels Normal Normal   Periphery Old retinal tear centered at 11:30 position superotemporally with excellent laser retinopexy no extension no subretinal fluid Normal            IMAGING AND PROCEDURES  Imaging and Procedures for 08/08/21  Color Fundus Photography Optos - OU - Both Eyes       Good retinopexy superiorly around retinal break.  Moderate epiretinal membrane.  A attached, vitreous debris persists.     OCT, Retina - OU - Both Eyes       Right Eye Quality was good. Scan locations included subfoveal. Central Foveal Thickness: 348. Progression has no prior data. Findings include abnormal foveal contour, epiretinal membrane.   Left Eye Quality was good. Scan locations included subfoveal. Central Foveal Thickness: 264. Progression has no prior data. Findings include normal foveal contour.   Notes Moderate to severe retinal thickening in the macular region, from epiretinal membrane, no schisis cavities informed intraretinal.  OD.  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  Really would like the right eye to be done as well, will consider vitrectomy associated also with epiretinal membrane peel at her return from travel in April 2023  Posterior vitreous detachment of left eye Condition resolved OS  Macular pucker, right eye Progression of epiretinal membrane now with some early distortion to the outer photoreceptor in the ellipsoid zone layer.  Pictures reviewed with the patient.     ICD-10-CM   1. 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 Vitreous membranes and strands, left eye  H43.312     3. Posterior vitreous detachment of left eye  H43.812     4. Posterior vitreous detachment, right eye  H43.811 OCT, Retina - OU - Both Eyes    5. Macular pucker, right eye  H35.371       1.  OD with moderate epiretinal membrane, slight signs of progression of the outer photoreceptor layer.  Vitreous strands of membranes still hampering activities of daily living.  History of retinal hole and tear in the right eye.  At time of vitrectomy membrane peel additional laser photocoagulation be applied around that area to prevent retinal tear detachment as well as any other sites that develop should they occur during surgery  2.  3.  Ophthalmic Meds Ordered this visit:  No orders of the defined types were placed in this encounter.      Return in about 3 months (around 11/05/2021) for dilate, OD, OCT.  There are no Patient Instructions on file for this visit.   Explained the diagnoses, plan, and follow up with the patient and they expressed understanding.  Patient expressed understanding of the importance of proper follow up care.   Clent Demark Jasin Brazel M.D. Diseases & Surgery of the Retina and Vitreous Retina [facility] Diabetic [place] 08/08/21     Abbreviations: M myopia (nearsighted); A astigmatism; H hyperopia (farsighted); P presbyopia; Mrx spectacle  prescription;  CTL contact lenses; OD right eye; OS left eye; OU both eyes  XT exotropia; ET esotropia; PEK punctate epithelial keratitis; PEE punctate epithelial erosions; DES dry eye syndrome; MGD meibomian gland dysfunction; ATs artificial tears; PFAT's preservative free artificial tears; [place] nuclear sclerotic cataract; PSC posterior subcapsular cataract; ERM epi-retinal membrane; PVD posterior vitreous detachment; RD retinal detachment; DM diabetes mellitus; DR diabetic retinopathy; NPDR non-proliferative diabetic retinopathy; PDR proliferative diabetic retinopathy; CSME clinically significant macular edema; DME diabetic macular edema; dbh dot blot hemorrhages; CWS cotton wool spot; POAG primary open angle glaucoma; C/D cup-to-disc ratio; HVF humphrey visual field; GVF goldmann visual field; OCT optical coherence tomography; IOP intraocular pressure; BRVO Branch retinal vein occlusion; CRVO central retinal vein occlusion; CRAO central retinal artery occlusion; BRAO branch retinal artery occlusion; RT retinal tear; SB scleral buckle; PPV pars plana vitrectomy; VH Vitreous hemorrhage; PRP panretinal laser photocoagulation; IVK intravitreal kenalog; VMT vitreomacular traction; MH Macular hole;  NVD neovascularization of the disc; NVE neovascularization elsewhere; AREDS age related eye disease study; ARMD age related macular degeneration; POAG primary open angle glaucoma; EBMD epithelial/anterior basement membrane dystrophy; ACIOL anterior chamber intraocular lens; IOL intraocular lens; PCIOL posterior chamber intraocular lens; Phaco/IOL phacoemulsification with intraocular lens placement; [place] photorefractive keratectomy; LASIK laser assisted in situ keratomileusis; HTN hypertension; DM diabetes mellitus; COPD chronic obstructive pulmonary disease

## 2021-08-08 NOTE — Assessment & Plan Note (Signed)
OS no new symptoms

## 2021-08-11 DIAGNOSIS — Z713 Dietary counseling and surveillance: Secondary | ICD-10-CM | POA: Diagnosis not present

## 2021-08-23 ENCOUNTER — Other Ambulatory Visit: Payer: Self-pay | Admitting: Obstetrics & Gynecology

## 2021-08-23 DIAGNOSIS — Z1231 Encounter for screening mammogram for malignant neoplasm of breast: Secondary | ICD-10-CM

## 2021-08-26 DIAGNOSIS — L299 Pruritus, unspecified: Secondary | ICD-10-CM | POA: Diagnosis not present

## 2021-08-26 DIAGNOSIS — L509 Urticaria, unspecified: Secondary | ICD-10-CM | POA: Diagnosis not present

## 2021-09-07 ENCOUNTER — Other Ambulatory Visit: Payer: Self-pay | Admitting: Internal Medicine

## 2021-09-07 DIAGNOSIS — L821 Other seborrheic keratosis: Secondary | ICD-10-CM | POA: Diagnosis not present

## 2021-09-07 DIAGNOSIS — L509 Urticaria, unspecified: Secondary | ICD-10-CM | POA: Diagnosis not present

## 2021-09-07 DIAGNOSIS — L570 Actinic keratosis: Secondary | ICD-10-CM | POA: Diagnosis not present

## 2021-09-07 DIAGNOSIS — D225 Melanocytic nevi of trunk: Secondary | ICD-10-CM | POA: Diagnosis not present

## 2021-09-08 ENCOUNTER — Other Ambulatory Visit: Payer: Self-pay

## 2021-09-08 ENCOUNTER — Encounter: Payer: Self-pay | Admitting: Internal Medicine

## 2021-09-08 ENCOUNTER — Ambulatory Visit: Payer: BC Managed Care – PPO | Admitting: Internal Medicine

## 2021-09-08 VITALS — BP 120/88 | HR 85 | Ht 66.0 in | Wt 145.4 lb

## 2021-09-08 DIAGNOSIS — K9041 Non-celiac gluten sensitivity: Secondary | ICD-10-CM | POA: Diagnosis not present

## 2021-09-08 DIAGNOSIS — E038 Other specified hypothyroidism: Secondary | ICD-10-CM

## 2021-09-08 DIAGNOSIS — E063 Autoimmune thyroiditis: Secondary | ICD-10-CM

## 2021-09-08 LAB — T4, FREE: Free T4: 0.85 ng/dL (ref 0.60–1.60)

## 2021-09-08 LAB — TSH: TSH: 3.27 u[IU]/mL (ref 0.35–5.50)

## 2021-09-08 NOTE — Progress Notes (Signed)
Patient ID: Mary Hughes, female   DOB: 1961/03/07, 61 y.o.   MRN: [id number] ? ?This visit occurred during the SARS-CoV-2 public health emergency.  Safety protocols were in place, including screening questions prior to the visit, additional usage of staff PPE, and extensive cleaning of exam room while observing appropriate contact time as indicated for disinfecting solutions.  ? ?HPI  ?Mary Hughes is a 61 y.o.-year-old female, initially referred by her ObGyn Dr., Dr Phineas Real, returning for f/u for Hashimoto's hypothyroidism. Last visit 1 year ago. ? ?Interim history: ?After stopping minocycline in 2021, she had occasional blemishes, but these resolved. ?Still has some tongue burning. 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  She started a gluten-free diet 1 month ago.  She also eliminated the foods that she is sensitive to.  She is feeling better afterwards ?She had hives last week from Allegra  - took Benadryl and Claritin >> resolved. ? ?Reviewed history: ?Pt. has been dx with Hashimoto's thyroiditis in 01/2015. ? ?Retrospectively, she had a thyroid U/S in 2002 for enlarged thyroid >> aspect of the thyroid was heterogeneous >> thyroid inflammation has been present since then.  No thyroid nodules. ? ?We started levothyroxine 10/2018.  She had tongue burning and tingling with generic levothyroxine >> switched to Tirosint 12/2018. ? ?She takes Tirosint 50 mcg daily: ?- in am ?- fasting ?- at least 30 min from b'fast ?- no Ca, Fe, PPIs ?- + MVI at night ?- not on Biotin ? ?Reviewed her TFTs: ?Lab Results  ?Component Value Date  ? TSH 2.300 08/09/2020  ? TSH 2.87 09/09/2019  ? TSH 2.27 12/13/2018  ? TSH 9.20 (H) 10/30/2018  ? TSH 6.20 (H) 09/09/2018  ? TSH 3.98 10/22/2017  ? TSH 4.70 (H) 09/07/2017  ? TSH 3.85 09/08/2016  ? TSH 4.43 03/16/2016  ? TSH 4.15 11/09/2015  ? FREET4 0.83 09/09/2019  ? FREET4 0.69 12/13/2018  ? FREET4 0.80 10/30/2018  ? FREET4 0.53 (L) 09/09/2018  ? FREET4  0.53 (L) 10/22/2017  ? FREET4 0.53 (L) 09/07/2017  ? FREET4 0.76 09/08/2016  ? FREET4 0.58 (L) 03/16/2016  ? FREET4 0.62 11/09/2015  ? FREET4 0.69 09/09/2015  ? T3FREE 3.3 09/09/2018  ? T3FREE 3.2 09/07/2017  ? T3FREE 3.4 09/08/2016  ? T3FREE 3.3 03/16/2016  ? T3FREE 2.9 11/09/2015  ? T3FREE 3.1 09/09/2015  ? T3FREE 3.2 03/12/2015  ? ?She had very high TPO antibodies: ?Component ?    Latest Ref Rng & Units 09/09/2019  ?Thyroperoxidase Ab SerPl-aCnc ?    <9 IU/mL >900 (H)  ? ?Component ?    Latest Ref Rng & Units 09/09/2018  ?Thyroperoxidase Ab SerPl-aCnc ?    <9 IU/mL >900 (H)  ? ?Component ?    Latest Ref Rng & Units 01/21/2015 09/09/2015 (on selenium) 09/08/2016 09/07/2017  ?Thyroperoxidase Ab SerPl-aCnc ?    <9 IU/mL >900 (H) >900 (H) >900 (H) >900 (H)  ? ?We tried selenium 200 mg daily 08/2016, but we stopped since her antibodies remain high. ? ?Pt denies: ?- feeling nodules in neck ?- hoarseness ?- dysphagia ?- choking ?- SOB with lying down ? ?She has + FH of thyroid disorders in: sister with Hashimoto's thyroiditis, + thyroid nodule in daughter.  ?No FH of thyroid cancer. No h/o radiation tx to head or neck. ?No herbal supplements. No Biotin use. No recent steroids use.  ? ?She also has HTN (dx 2016), GERD. She had cataracts removed  (hereditary cataracts). ? ?In  2021, she was on minocycline every other day for her skin -for many years.  We stopped the antibiotic. ? ?ROS: ?+ see HPI ? ?I reviewed pt's medications, allergies, PMH, social hx, family hx, and changes were documented in the history of present illness. Otherwise, unchanged from my initial visit note. ? ?Past Medical History:  ?Diagnosis Date  ? Hypertension   ? Ovarian mass 06/2005  ? RIGHT OVARIAN ECHOGENIC MASS  ? S/P endometrial ablation 10/07  ? Thyroid disease   ? Hypo thyroid  ? ?Past Surgical History:  ?Procedure Laterality Date  ? COLONOSCOPY  04/03/2012  ? Procedure: COLONOSCOPY;  Surgeon: Wonda Horner, MD;  Location: [facility];  Service:  Endoscopy;  Laterality: N/A;  ? ENDOMETRIAL ABLATION  04/18/2006  ? HIP SURGERY  08/2004  ? LEFT HIP REPLACEMENT  ? HYSTEROSCOPY  04/18/2006  ? HYST, D&C WITH NOVASURE  ? JOINT REPLACEMENT    ? left hip  ? PILONIDAL CYST DRAINAGE    ? Removed  ? ?Social History  ? ?Social History  ? Marital Status: Married  ?  Spouse Name: N/A  ? Number of Children: 2  ? ?Occupational History  ? COO at [employer]  ? ?Social History Main Topics  ? Smoking status: Never Smoker   ? Smokeless tobacco: Never Used  ? Alcohol Use: 6.0 oz/week  ?  Wine, beer - 5x a week, 2-3 Standard drinks or equivalent  ? Drug Use: No  ? Sexual Activity: Yes  ?  Birth Control/ Protection: Other-see comments  ?   Comment: Vasectomy-1st intercourse 61 yo-Fewer than 5 partners  ? ?Current Outpatient Medications on File Prior to Visit  ?Medication Sig Dispense Refill  ? cholecalciferol (VITAMIN D) 1000 UNITS tablet Take 2,000 Units by mouth daily.     ? Clindamycin-Benzoyl Per, Refr, gel Apply 1 application topically as directed.     ? hydrochlorothiazide (MICROZIDE) 12.5 MG capsule TAKE 1 CAPSULE BY MOUTH EVERY DAY 90 capsule 0  ? lisinopril (PRINIVIL,ZESTRIL) 5 MG tablet Take 20 mg by mouth daily.   3  ? Multiple Vitamin (MULTI VITAMIN DAILY PO) Take by mouth.    ? TIROSINT 50 MCG CAPS TAKE 1 CAPSULE DAILY BY MOUTH BEFORE BREAKFAST 90 capsule 3  ? ?No current facility-administered medications on file prior to visit.  ? ?Allergies  ?Allergen Reactions  ? Estradiol Other (See Comments)  ? ?Family History  ?Problem Relation Age of Onset  ? Hypertension Mother   ? Lung cancer Mother   ? Heart disease Father   ? Cancer Father   ?     Throat,Lymphoma  ? Diabetes Maternal Grandmother   ? Heart disease Maternal Grandfather   ? ?PE: ?BP 120/88 (BP Location: Right Arm, Patient Position: Sitting, Cuff Size: Normal)   Pulse 85   Ht '5\' 6"'$  (1.676 m)   Wt 145 lb 6.4 oz (66 kg)   SpO2 99%   BMI 23.47 kg/m?   ?Wt Readings from Last 3 Encounters:  ?09/08/21 145  lb 6.4 oz (66 kg)  ?09/08/20 150 lb 3.2 oz (68.1 kg)  ?08/09/20 153 lb (69.4 kg)  ? ?Constitutional: normal weight, in NAD ?Eyes: PERRLA, EOMI, no exophthalmos ?ENT: moist mucous membranes, no thyromegaly, no cervical lymphadenopathy ?Cardiovascular: RRR, No MRG ?Respiratory: CTA B ?Musculoskeletal: no deformities, strength intact in all 4 ?Skin: moist, warm, no rashes ?Neurological: no tremor with outstretched hands, DTR normal in all 4 ? ?ASSESSMENT: ?1. Hashimoto thyroiditis ?- Thyroid ultrasound (07/26/2000):  ?THE  THYROID GLAND IS MILDLY HETEROGENEOUS WITHOUT DEFINITE FOCAL LESIONS OR CYSTS.   ?THE RIGHT LOBEOF THE THYROID MEASURES 1.1 X 1.5 X 4.5 CM  ?THE LEFT LOBE OF THE THYROID MEASURES 1.4 X 1.1 X 4.3 CM.    ?NO OTHER ABNORMALITIES ARE IDENTIFIED. ? ?2.  Meet sensitivity ? ?PLAN: ?1. Hashimoto's hypothyroidism ?-Patient with previous history of euthyroid Hashimoto's thyroiditis, with very high TPO antibodies, not responding to selenium.  She is off the supplement now.  TSH started to increase afterwards and we had to start the low-dose levothyroxine.  She had morning and evening of her tongue with a tablets of levothyroxine so we switched to [place] in 2020.  However, the tongue burning sensation continued.  Reviewing her medication list, she was on minocycline and reviewing the medication side effects, these may have been the cause for her hypothyroidism and also her burning tongue.  She stopped the medication before last visit and her symptoms improved.  However, since last visit, she was found to have several food allergies which may also have contributed. ?- latest thyroid labs reviewed with pt. >> normal: ?Lab Results  ?Component Value Date  ? TSH 2.300 08/09/2020  ?- she continues on LT4 50 mcg daily ?- pt feels good on this dose. ?- we discussed about taking the thyroid hormone every day, with water, >30 minutes before breakfast, separated by >4 hours from acid reflux medications, calcium, iron,  multivitamins. Pt. is taking it correctly. ?- will check thyroid tests today: TSH and fT4 ?- If labs are abnormal, she will need to return for repeat TFTs in 1.5 months ?- RTC in 1 year or possibly sooner for

## 2021-09-08 NOTE — Patient Instructions (Signed)
Please continue Tirosint 50 mcg daily.  Take the thyroid hormone every day, with water, at least 30 minutes before breakfast, separated by at least 4 hours from: - acid reflux medications - calcium - iron - multivitamins  Please stop at the lab.  Please come back for a follow-up appointment in 1 year. 

## 2021-09-12 LAB — THYROID PEROXIDASE ANTIBODY: Thyroperoxidase Ab SerPl-aCnc: >900 IU/mL — ABNORMAL HIGH (ref <9)

## 2021-09-12 LAB — CELIAC DISEASE PANEL
(tTG) Ab, IgA: <1 U/mL
(tTG) Ab, IgG: <1 U/mL
Gliadin IgA: <1 U/mL
Gliadin IgG: <1 U/mL
Immunoglobulin A: 399 mg/dL — ABNORMAL HIGH (ref 47–310)

## 2021-09-15 ENCOUNTER — Ambulatory Visit
Admission: RE | Admit: 2021-09-15 | Discharge: 2021-09-15 | Disposition: A | Discharge status: Home / Self Care | Payer: BC Managed Care – PPO | Source: Ambulatory Visit | Attending: Obstetrics & Gynecology | Admitting: Obstetrics & Gynecology

## 2021-09-15 DIAGNOSIS — Z1231 Encounter for screening mammogram for malignant neoplasm of breast: Secondary | ICD-10-CM

## 2021-09-22 DIAGNOSIS — Z713 Dietary counseling and surveillance: Secondary | ICD-10-CM | POA: Diagnosis not present

## 2021-09-26 ENCOUNTER — Encounter: Payer: Self-pay | Admitting: Obstetrics & Gynecology

## 2021-09-26 ENCOUNTER — Other Ambulatory Visit (HOSPITAL_COMMUNITY)
Admission: RE | Admit: 2021-09-26 | Discharge: 2021-09-26 | Disposition: A | Discharge status: Home / Self Care | Payer: BC Managed Care – PPO | Source: Ambulatory Visit | Attending: Obstetrics & Gynecology | Admitting: Obstetrics & Gynecology

## 2021-09-26 ENCOUNTER — Ambulatory Visit (INDEPENDENT_AMBULATORY_CARE_PROVIDER_SITE_OTHER): Payer: BC Managed Care – PPO | Admitting: Obstetrics & Gynecology

## 2021-09-26 VITALS — BP 122/78 | HR 97 | Ht 66.5 in | Wt 144.0 lb

## 2021-09-26 DIAGNOSIS — Z780 Asymptomatic menopausal state: Secondary | ICD-10-CM | POA: Diagnosis not present

## 2021-09-26 DIAGNOSIS — Z01419 Encounter for gynecological examination (general) (routine) without abnormal findings: Secondary | ICD-10-CM

## 2021-09-26 NOTE — Progress Notes (Signed)
? ? ?WILMER BERRYHILL February 08, 1961 [id number] ? ? ?History:    61 y.o. G2P2L2 ?  ?RP:  Established patient presenting for annual gyn exam  ?  ?HPI: Postmenopausal well on no HRT.  No PMB.  No pelvic pain. No pain with IC.  Pap Neg 02/2018.  No h/o abnormal Pap.  Pap reflex today.  Breasts normal.  Mammo Neg 08/2021.  [place] 2018, repeat this year.  Urine/BMs normal.  BMI 22.89. Good fitness.  Health labs with Fam MD.   ? ? ?Past medical history,surgical history, family history and social history were all reviewed and documented in the EPIC chart. ? ?Gynecologic History ?No LMP recorded. Patient has had an ablation. ? ?Obstetric History ?OB History  ?Gravida Para Term Preterm AB Living  ?'2 2 2   '$ 0 2  ?SAB IAB Ectopic Multiple Live Births  ?0   0      ?  ?# Outcome Date GA Lbr Len/2nd Weight Sex Delivery Anes PTL Lv  ?2 Term           ?1 Term           ? ? ? ?ROS: A ROS was performed and pertinent positives and negatives are included in the history. ? GENERAL: No fevers or chills. HEENT: No change in vision, no earache, sore throat or sinus congestion. NECK: No pain or stiffness. CARDIOVASCULAR: No chest pain or pressure. No palpitations. PULMONARY: No shortness of breath, cough or wheeze. GASTROINTESTINAL: No abdominal pain, nausea, vomiting or diarrhea, melena or bright red blood per rectum. GENITOURINARY: No urinary frequency, urgency, hesitancy or dysuria. MUSCULOSKELETAL: No joint or muscle pain, no back pain, no recent trauma. DERMATOLOGIC: No rash, no itching, no lesions. ENDOCRINE: No polyuria, polydipsia, no heat or cold intolerance. No recent change in weight. HEMATOLOGICAL: No anemia or easy bruising or bleeding. NEUROLOGIC: No headache, seizures, numbness, tingling or weakness. PSYCHIATRIC: No depression, no loss of interest in normal activity or change in sleep pattern.  ?  ? ?Exam: ? ? ?BP 122/78   Pulse 97   Ht 5' 6.5" (1.689 m)   Wt 144 lb (65.3 kg)   SpO2 97%   BMI 22.89 kg/m?  ? ?Body mass index is  22.89 kg/m?. ? ?General appearance : Well developed well nourished female. No acute distress ?HEENT: Eyes: no retinal hemorrhage or exudates,  Neck supple, trachea midline, no carotid bruits, no thyroidmegaly ?Lungs: Clear to auscultation, no rhonchi or wheezes, or rib retractions  ?Heart: Regular rate and rhythm, no murmurs or gallops ?Breast:Examined in sitting and supine position were symmetrical in appearance, no palpable masses or tenderness,  no skin retraction, no nipple inversion, no nipple discharge, no skin discoloration, no axillary or supraclavicular lymphadenopathy ?Abdomen: no palpable masses or tenderness, no rebound or guarding ?Extremities: no edema or skin discoloration or tenderness ? ?Pelvic: Vulva: Normal ?            Vagina: No gross lesions or discharge ? Cervix: No gross lesions or discharge.  Pap reflex done. ? Uterus  AV, normal size, shape and consistency, non-tender and mobile ? Adnexa  Without masses or tenderness ? Anus: Normal ? ? ?Assessment/Plan:  61 y.o. female for annual exam  ? ?1. Encounter for routine gynecological examination with Papanicolaou smear of cervix ?Postmenopausal well on no HRT.  No PMB.  No pelvic pain. No pain with IC.  Pap Neg 02/2018.  No h/o abnormal Pap.  Pap reflex today.  Breasts normal.  Mammo Neg 08/2021.  [place] 2018, repeat this year.  Urine/BMs normal.  BMI 22.89. Good fitness.  Health labs with Fam MD.   ? ?- Cytology - PAP( [place]) ? ?2. Postmenopause ?Postmenopausal well on no HRT.  No PMB.  No pelvic pain. No pain with IC.  On Vit D supplements, good Ca++ intake and regular weight bearing physical activities. ? ?Other orders ?- lisinopril (ZESTRIL) 20 MG tablet; 1 tablet ?- meloxicam (MOBIC) 15 MG tablet; Take 15 mg by mouth daily. ?- Chelated Zinc 50 MG TABS  ? ?Princess Bruins MD, 8:19 AM 09/26/2021 ? ?  ?

## 2021-09-27 LAB — CYTOLOGY - PAP: Diagnosis: NEGATIVE

## 2021-10-11 ENCOUNTER — Encounter: Payer: Self-pay | Admitting: Internal Medicine

## 2021-10-11 ENCOUNTER — Other Ambulatory Visit: Payer: Self-pay | Admitting: Internal Medicine

## 2021-10-11 MED ORDER — LEVOTHYROXINE SODIUM 50 MCG PO TABS: 50.0000 ug | ORAL_TABLET | Freq: Every day | ORAL | 3 refills | Status: DC

## 2021-10-12 ENCOUNTER — Ambulatory Visit: Payer: BC Managed Care – PPO | Admitting: Internal Medicine

## 2021-10-12 ENCOUNTER — Encounter: Payer: Self-pay | Admitting: Internal Medicine

## 2021-10-12 VITALS — BP 126/90 | HR 84 | Ht 66.0 in | Wt 144.2 lb

## 2021-10-12 DIAGNOSIS — I341 Nonrheumatic mitral (valve) prolapse: Secondary | ICD-10-CM

## 2021-10-12 DIAGNOSIS — Z79899 Other long term (current) drug therapy: Secondary | ICD-10-CM | POA: Diagnosis not present

## 2021-10-12 DIAGNOSIS — I10 Essential (primary) hypertension: Secondary | ICD-10-CM | POA: Diagnosis not present

## 2021-10-12 NOTE — Progress Notes (Signed)
? ?Cardiology Office Note ? ? ?Date:  10/12/2021  ? ?ID:  Mary Hughes, DOB Mar 29, 1961, MRN [id number] ? ?PCP:  Gaynelle Arabian, MD  ?Cardiologist:   Dorris Carnes, MD  ? ?Pt presents for continued cardiac care of HTN and MV dz    ?  ?History of Present Illness: ?Mary Hughes is a 61 y.o. female with a history of HTN  And mitral regurgitation  who presents for continued care   [facility] was previously followed by Thurman Coyer   The pt had an echo in 2018 that showed mild LVH and mild to mod MR   ? ?I last saw the pt in Jan 2021   Echo was done in Feb 2021 ? ?The pt says she is feeling good  NO CP  Breathing is good  Just got back from a trip in the [place] ? ?    ? ? ? ? ?Current Meds  ?Medication Sig  ? Chelated Zinc 50 MG TABS   ? cholecalciferol (VITAMIN D) 1000 UNITS tablet Take 2,000 Units by mouth daily.   ? Clindamycin-Benzoyl Per, Refr, gel Apply 1 application topically as directed.   ? hydrochlorothiazide (MICROZIDE) 12.5 MG capsule TAKE 1 CAPSULE BY MOUTH EVERY DAY  ? levothyroxine (SYNTHROID) 50 MCG tablet Take 1 tablet (50 mcg total) by mouth daily.  ? lisinopril (ZESTRIL) 20 MG tablet 1 tablet  ? meloxicam (MOBIC) 15 MG tablet Take 15 mg by mouth daily.  ? Multiple Vitamin (MULTI VITAMIN DAILY PO) Take by mouth.  ? ? ? ?Allergies:   Estradiol  ? ?Past Medical History:  ?Diagnosis Date  ? Hypertension   ? Ovarian mass 06/2005  ? RIGHT OVARIAN ECHOGENIC MASS  ? S/P endometrial ablation 10/07  ? Thyroid disease   ? Hypo thyroid  ? ? ?Past Surgical History:  ?Procedure Laterality Date  ? COLONOSCOPY  04/03/2012  ? Procedure: COLONOSCOPY;  Surgeon: Wonda Horner, MD;  Location: [facility];  Service: Endoscopy;  Laterality: N/A;  ? ENDOMETRIAL ABLATION  04/18/2006  ? HIP SURGERY  08/2004  ? LEFT HIP REPLACEMENT  ? HYSTEROSCOPY  04/18/2006  ? HYST, D&C WITH NOVASURE  ? JOINT REPLACEMENT    ? left hip  ? PILONIDAL CYST DRAINAGE    ? Removed  ? ? ? ?Social History:  The patient  reports that she has never smoked. She has  never used smokeless tobacco. She reports current alcohol use of about 10.0 standard drinks per week. She reports that she does not use drugs.  ? ?Family History:  The patient's family history includes Cancer in her father; Diabetes in her maternal grandmother; Heart disease in her father and maternal grandfather; Hypertension in her mother; Lung cancer in her mother.  ? ? ?ROS:  Please see the history of present illness. All other systems are reviewed and  Negative to the above problem except as noted.  ? ? ?PHYSICAL EXAM: ?VS:  BP 126/90   Pulse 84   Ht '5\' 6"'$  (1.676 m)   Wt 144 lb 3.2 oz (65.4 kg)   SpO2 97%   BMI 23.27 kg/m?   ?GEN: Well nourished, well developed, in no acute distress  ?HEENT: normal  ?Neck: no JVD, carotid bruits  ?Cardiac: RRR; no murmurs   No LE edema  ?Respiratory:  clear to auscultation bilaterally  ?GI: soft, nontender, nondistended, + BS  No hepatomegaly  ?MS: no deformity Moving all extremities   ?Skin: warm and dry, no rash ?Neuro:  Strength  and sensation are intact ?Psych: euthymic mood, full affect ? ? ?EKG:  EKG is ordered today.  SR 84 ? ?Echo   07/22/19 ? ?1. Left ventricular ejection fraction, by visual estimation, is 60 to 65%. The left ventricle has ?normal function. There is no left ventricular hypertrophy. ?2. Global right ventricle has normal systolic function.The right ventricular size is normal. No ?increase in right ventricular wall thickness. ?3. Left atrial size was normal. ?4. Right atrial size was normal. ?5. The mitral valve is abnormal. Mild mitral valve regurgitation. No evidence of mitral stenosis. ?Late systolic bowing without frank prolapse. ?6. The tricuspid valve is normal in structure. ?7. The tricuspid valve is normal in structure. Tricuspid valve regurgitation is trivial. ?8. The aortic valve is normal in structure. Aortic valve regurgitation is not visualized. No ?evidence of aortic valve sclerosis or stenosis. ?9. The pulmonic valve was normal in  structure. Pulmonic valve regurgitation is trivial. ?10. Normal pulmonary artery systolic pressure. ?11. The inferior vena cava is normal in size with greater than 50% respiratory variability, ?suggesting right atrial pressure of 3 mmHg. ?12. Left ventricular diastolic parameters are consistent with Grade I diastolic dysfunction ?(impaired relaxation). ?13. The left ventricle has no regional wall motion abnormalities. ? ?Lipid Panel ?   ?Component Value Date/Time  ? CHOL 201 (H) 08/09/2020 0845  ? TRIG 57 08/09/2020 0845  ? HDL 85 08/09/2020 0845  ? CHOLHDL 2.4 08/09/2020 0845  ? CHOLHDL 2.4 01/18/2015 0930  ? VLDL 12 01/18/2015 0930  ? [place] 105 (H) 08/09/2020 0845  ? ?  ? ?Wt Readings from Last 3 Encounters:  ?10/12/21 144 lb 3.2 oz (65.4 kg)  ?09/26/21 144 lb (65.3 kg)  ?09/08/21 145 lb 6.4 oz (66 kg)  ?  ? ? ?ASSESSMENT AND PLAN: ? ?1.  HTN   BP diastolic is a little high   I have asked pt to follow this at home Email if BP (esp diastolic ) running 90 or greater  ? ?2    HL    WIll check lipomed, ApoB Lpa ? ?3 Diet   Now gluten free ? ?4  Allergy   has had some facial swelling   Has appt in allergy   ? ?Plan for f/u in 1 yrear  ?Current medicines are reviewed at length with the patient today.  The patient does not have concerns regarding medicines. ? ?Signed, ?Dorris Carnes, MD  ?10/12/2021 8:46 AM    ?[facility] ?[address], [place], [place]  [address] ?Phone: [phone]; Fax: [phone]  ? ? ?

## 2021-10-12 NOTE — Patient Instructions (Signed)
Medication Instructions:  ? ?*If you need a refill on your cardiac medications before your next appointment, please call your pharmacy* ? ? ?Lab Work: ?LIPOMED PANEL ?BMET ?If you have labs (blood work) drawn today and your tests are completely normal, you will receive your results only by: ?MyChart Message (if you have MyChart) OR ?A paper copy in the mail ?If you have any lab test that is abnormal or we need to change your treatment, we will call you to review the results. ? ? ?Testing/Procedures: ? ? ? ?Follow-Up: ?At [facility], you and your health needs are our priority.  As part of our continuing mission to provide you with exceptional heart care, we have created designated Provider Care Teams.  These Care Teams include your primary Cardiologist (physician) and Advanced Practice Providers (APPs -  Physician Assistants and Nurse Practitioners) who all work together to provide you with the care you need, when you need it. ? ?We recommend signing up for the patient portal called "MyChart".  Sign up information is provided on this After Visit Summary.  MyChart is used to connect with patients for Virtual Visits (Telemedicine).  Patients are able to view lab/test results, encounter notes, upcoming appointments, etc.  Non-urgent messages can be sent to your provider as well.   ?To learn more about what you can do with MyChart, go to [email or web address].   ? ?Your next appointment:   ?1 year(s) ? ?The format for your next appointment:   ?In Person ? ?Provider:   ?Dorris Carnes, MD   ? ? ?Other Instructions ? ? ?Important Information About Sugar ? ? ? ? ?  ?

## 2021-10-13 LAB — BASIC METABOLIC PANEL
BUN/Creatinine Ratio: 17 (ref 9–23)
BUN: 13 mg/dL (ref 6–24)
CO2: 21 mmol/L (ref 20–29)
Calcium: 10 mg/dL (ref 8.7–10.2)
Chloride: 101 mmol/L (ref 96–106)
Creatinine, Ser: 0.77 mg/dL (ref 0.57–1.00)
Glucose: 86 mg/dL (ref 70–99)
Potassium: 4.6 mmol/L (ref 3.5–5.2)
Sodium: 141 mmol/L (ref 134–144)
eGFR: 89 mL/min/{1.73_m2} (ref >59)

## 2021-10-13 LAB — NMR, LIPOPROFILE
Cholesterol, Total: 227 mg/dL — ABNORMAL HIGH (ref 100–199)
HDL Particle Number: 46.9 umol/L (ref >=30.5)
HDL-C: 92 mg/dL (ref >39)
LDL Particle Number: 969 nmol/L (ref <1000)
LDL Size: 22.1 nm (ref >20.5)
LDL-C (NIH Calc): 124 mg/dL — ABNORMAL HIGH (ref 0–99)
LP-IR Score: 30 (ref <=45)
Small LDL Particle Number: <90 nmol/L (ref <=527)
Triglycerides: 64 mg/dL (ref 0–149)

## 2021-10-13 LAB — LIPOPROTEIN A (LPA): Lipoprotein (a): 13.6 nmol/L (ref <75.0)

## 2021-10-13 LAB — APOLIPOPROTEIN B: Apolipoprotein B: 90 mg/dL — ABNORMAL HIGH (ref <90)

## 2021-10-20 DIAGNOSIS — Z713 Dietary counseling and surveillance: Secondary | ICD-10-CM | POA: Diagnosis not present

## 2021-10-28 DIAGNOSIS — T783XXD Angioneurotic edema, subsequent encounter: Secondary | ICD-10-CM | POA: Diagnosis not present

## 2021-10-28 DIAGNOSIS — L501 Idiopathic urticaria: Secondary | ICD-10-CM | POA: Diagnosis not present

## 2021-10-28 DIAGNOSIS — J301 Allergic rhinitis due to pollen: Secondary | ICD-10-CM | POA: Diagnosis not present

## 2021-11-07 ENCOUNTER — Encounter (INDEPENDENT_AMBULATORY_CARE_PROVIDER_SITE_OTHER): Payer: Self-pay | Admitting: Ophthalmology

## 2021-11-07 ENCOUNTER — Ambulatory Visit (INDEPENDENT_AMBULATORY_CARE_PROVIDER_SITE_OTHER): Payer: BC Managed Care – PPO | Admitting: Ophthalmology

## 2021-11-07 DIAGNOSIS — H43312 Vitreous membranes and strands, left eye: Secondary | ICD-10-CM | POA: Diagnosis not present

## 2021-11-07 DIAGNOSIS — H33311 Horseshoe tear of retina without detachment, right eye: Secondary | ICD-10-CM

## 2021-11-07 DIAGNOSIS — H43311 Vitreous membranes and strands, right eye: Secondary | ICD-10-CM

## 2021-11-07 DIAGNOSIS — H35371 Puckering of macula, right eye: Secondary | ICD-10-CM | POA: Diagnosis not present

## 2021-11-07 DIAGNOSIS — H43313 Vitreous membranes and strands, bilateral: Secondary | ICD-10-CM

## 2021-11-07 NOTE — Assessment & Plan Note (Signed)
Large retinopexy centered at 12:00 superiorly.  No new breaks OD

## 2021-11-07 NOTE — Assessment & Plan Note (Signed)
Mild ERM with no progression over the last 6 months, will let us continue to simply observe and now with some improvements noted by OCT measures, will in fact follow-up in 1 year.  Patient understands the importance of monthly evaluation of the visual symptomatology that could occur from ERM progression most notably with me with reading glasses alone up close

## 2021-11-07 NOTE — Assessment & Plan Note (Signed)
Mild to moderate symptomatology.  Patient tolerating these well

## 2021-11-07 NOTE — Progress Notes (Signed)
11/07/2021     CHIEF COMPLAINT Patient presents for  Chief Complaint  Patient presents with   Retina Follow Up      HISTORY OF PRESENT ILLNESS: Mary Hughes is a 61 y.o. female who presents to the clinic today for:   HPI     Retina Follow Up           Diagnosis: Vitreous membranes and strands   Laterality: right eye   Onset: 3 months ago         Comments   3 mos fu dilate OD, OCT. Patient states vision is stable and unchanged since last visit. Denies any new floaters or FOL.       Last edited by Laurin Coder on 11/07/2021 10:45 AM.      Referring physician: Luberta Mutter, MD [address],  [place] [address]  HISTORICAL INFORMATION:   Selected notes from the MEDICAL RECORD NUMBER       CURRENT MEDICATIONS: No current outpatient medications on file. (Ophthalmic Drugs)   No current facility-administered medications for this visit. (Ophthalmic Drugs)   Current Outpatient Medications (Other)  Medication Sig   Chelated Zinc 50 MG TABS    cholecalciferol (VITAMIN D) 1000 UNITS tablet Take 2,000 Units by mouth daily.    Clindamycin-Benzoyl Per, Refr, gel Apply 1 application topically as directed.    hydrochlorothiazide (MICROZIDE) 12.5 MG capsule TAKE 1 CAPSULE BY MOUTH EVERY DAY   levothyroxine (SYNTHROID) 50 MCG tablet Take 1 tablet (50 mcg total) by mouth daily.   lisinopril (ZESTRIL) 20 MG tablet 1 tablet   meloxicam (MOBIC) 15 MG tablet Take 15 mg by mouth daily.   Multiple Vitamin (MULTI VITAMIN DAILY PO) Take by mouth.   No current facility-administered medications for this visit. (Other)      REVIEW OF SYSTEMS: ROS   Negative for: Constitutional, Gastrointestinal, Neurological, Skin, Genitourinary, Musculoskeletal, HENT, Endocrine, Cardiovascular, Eyes, Respiratory, Psychiatric, Allergic/Imm, Heme/Lymph Last edited by Hurman Horn, MD on 11/07/2021 11:22 AM.       ALLERGIES Allergies  Allergen Reactions   Estradiol  Other (See Comments)    PAST MEDICAL HISTORY Past Medical History:  Diagnosis Date   Hypertension    Ovarian mass 06/2005   RIGHT OVARIAN ECHOGENIC MASS   S/P endometrial ablation 10/07   Thyroid disease    Hypo thyroid   Past Surgical History:  Procedure Laterality Date   COLONOSCOPY  04/03/2012   Procedure: COLONOSCOPY;  Surgeon: Wonda Horner, MD;  Location: [facility];  Service: Endoscopy;  Laterality: N/A;   ENDOMETRIAL ABLATION  04/18/2006   HIP SURGERY  08/2004   LEFT HIP REPLACEMENT   HYSTEROSCOPY  04/18/2006   HYST, D&C WITH NOVASURE   JOINT REPLACEMENT     left hip   PILONIDAL CYST DRAINAGE     Removed    FAMILY HISTORY Family History  Problem Relation Age of Onset   Hypertension Mother    Lung cancer Mother    Heart disease Father    Cancer Father        Throat,Lymphoma   Diabetes Maternal Grandmother    Heart disease Maternal Grandfather    Breast cancer Neg Hx     SOCIAL HISTORY Social History   Tobacco Use   Smoking status: Never   Smokeless tobacco: Never  Vaping Use   Vaping Use: Never used  Substance Use Topics   Alcohol use: Yes    Alcohol/week: 10.0 standard drinks    Types:  10 Standard drinks or equivalent per week   Drug use: No         OPHTHALMIC EXAM:  Base Eye Exam     Visual Acuity (ETDRS)       Right Left   Dist [place] 20/20 -2 20/25 -1+2         Tonometry (Tonopen, 10:46 AM)       Right Left   Pressure 12 15         Pupils       Pupils Dark Light APD   Right PERRL 4 3 None   Left PERRL 4 3 None         Visual Fields (Counting fingers)       Left Right    Full Full         Extraocular Movement       Right Left    Full Full         Neuro/Psych     Oriented x3: Yes   Mood/Affect: Normal         Dilation     Right eye: 1.0% Mydriacyl, 2.5% Phenylephrine @ 10:46 AM           Slit Lamp and Fundus Exam     External Exam       Right Left   External Normal Normal          Slit Lamp Exam       Right Left   Lids/Lashes Normal Normal   Conjunctiva/Sclera White and quiet White and quiet   Cornea Clear Clear   Anterior Chamber Deep and quiet Deep and quiet   Iris Round and reactive Round and reactive   Lens Centered posterior chamber intraocular lens Centered posterior chamber intraocular lens   Anterior Vitreous Normal clear, avitric         Fundus Exam       Right Left   Posterior Vitreous Posterior vitreous detachment, Central vitreous floaters    Disc With coving of the vessels at the superior pole and inferior pole of nerve    C/D Ratio 0.65    Macula Epiretinal membrane, moderate topographic change    Vessels Normal    Periphery Old retinal tear centered at 11:30 position superotemporally with excellent laser retinopexy no extension no subretinal fluid             IMAGING AND PROCEDURES  Imaging and Procedures for 11/07/21  OCT, Retina - OU - Both Eyes       Right Eye Quality was good. Scan locations included subfoveal. Central Foveal Thickness: 345. Progression has improved. Findings include epiretinal membrane, abnormal foveal contour.   Left Eye Quality was good. Scan locations included subfoveal. Central Foveal Thickness: 268. Progression has been stable. Findings include normal foveal contour.   Notes OD, with less significant macular thickening from epiretinal membrane, as the foveal component of the ERM appears to be resolved perhaps from tangential traction in a centripetal fashion leading to attenuation of this area thus secondary foveal thickening diminishing over time  OCT comparisons from June 2022 seem to confirm this.  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  Patient tolerating these well  Retinal tear of right eye Large retinopexy centered at 12:00  superiorly.  No new breaks OD  Macular pucker, right eye  Mild ERM with no progression over the last 6 months, will let us continue to simply observe and now with some improvements noted by OCT measures, will in fact follow-up in 1 year.  Patient understands the importance of monthly evaluation of the visual symptomatology that could occur from ERM progression most notably with me with reading glasses alone up close     ICD-10-CM   1. 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 Vitreous membranes and strands, left eye  H43.312     3. Retinal tear of right eye  H33.311     4. Macular pucker, right eye  H35.371       1.  OD, mild to moderate epiretinal membrane stable if not improved spontaneously with less significant thickening centrally and no signs of secondary actual damage from the ERM.  2.  Moderate vitreous strands membranes in the right eye, these continue but not substantially tactful on her activities of daily living she prefers to observe  3.  Ophthalmic Meds Ordered this visit:  No orders of the defined types were placed in this encounter.      Return in about 1 year (around 11/08/2022) for DILATE OU, COLOR FP, OCT.  There are no Patient Instructions on file for this visit.   Explained the diagnoses, plan, and follow up with the patient and they expressed understanding.  Patient expressed understanding of the importance of proper follow up care.   Clent Demark Cecilee Rosner M.D. Diseases & Surgery of the Retina and Vitreous [facility] [place] 11/07/21     Abbreviations: M myopia (nearsighted); A astigmatism; H hyperopia (farsighted); P presbyopia; Mrx spectacle prescription;  CTL contact lenses; OD right eye; OS left eye; OU both eyes  XT exotropia; ET esotropia; PEK punctate epithelial keratitis; PEE punctate epithelial erosions; DES dry eye syndrome; MGD meibomian gland dysfunction; ATs artificial tears; PFAT's  preservative free artificial tears; [place] nuclear sclerotic cataract; PSC posterior subcapsular cataract; ERM epi-retinal membrane; PVD posterior vitreous detachment; RD retinal detachment; DM diabetes mellitus; DR diabetic retinopathy; NPDR non-proliferative diabetic retinopathy; PDR proliferative diabetic retinopathy; CSME clinically significant macular edema; DME diabetic macular edema; dbh dot blot hemorrhages; CWS cotton wool spot; POAG primary open angle glaucoma; C/D cup-to-disc ratio; HVF humphrey visual field; GVF goldmann visual field; OCT optical coherence tomography; IOP intraocular pressure; BRVO Branch retinal vein occlusion; CRVO central retinal vein occlusion; CRAO central retinal artery occlusion; BRAO branch retinal artery occlusion; RT retinal tear; SB scleral buckle; PPV pars plana vitrectomy; VH Vitreous hemorrhage; PRP panretinal laser photocoagulation; IVK intravitreal kenalog; VMT vitreomacular traction; MH Macular hole;  NVD neovascularization of the disc; NVE neovascularization elsewhere; AREDS age related eye disease study; ARMD age related macular degeneration; POAG primary open angle glaucoma; EBMD epithelial/anterior basement membrane dystrophy; ACIOL anterior chamber intraocular lens; IOL intraocular lens; PCIOL posterior chamber intraocular lens; Phaco/IOL phacoemulsification with intraocular lens placement; [place] photorefractive keratectomy; LASIK laser assisted in situ keratomileusis; HTN hypertension; DM diabetes mellitus; COPD chronic obstructive pulmonary disease

## 2021-11-23 DIAGNOSIS — H35371 Puckering of macula, right eye: Secondary | ICD-10-CM | POA: Diagnosis not present

## 2021-11-23 DIAGNOSIS — H40013 Open angle with borderline findings, low risk, bilateral: Secondary | ICD-10-CM | POA: Diagnosis not present

## 2021-11-23 DIAGNOSIS — Z961 Presence of intraocular lens: Secondary | ICD-10-CM | POA: Diagnosis not present

## 2021-11-23 DIAGNOSIS — H524 Presbyopia: Secondary | ICD-10-CM | POA: Diagnosis not present

## 2021-12-08 ENCOUNTER — Ambulatory Visit: Payer: BC Managed Care – PPO | Admitting: Internal Medicine

## 2021-12-08 ENCOUNTER — Other Ambulatory Visit: Payer: Self-pay | Admitting: Internal Medicine

## 2022-01-05 ENCOUNTER — Ambulatory Visit (INDEPENDENT_AMBULATORY_CARE_PROVIDER_SITE_OTHER): Payer: BC Managed Care – PPO | Admitting: Ophthalmology

## 2022-01-05 DIAGNOSIS — H43311 Vitreous membranes and strands, right eye: Secondary | ICD-10-CM | POA: Diagnosis not present

## 2022-01-05 DIAGNOSIS — H33311 Horseshoe tear of retina without detachment, right eye: Secondary | ICD-10-CM

## 2022-01-05 DIAGNOSIS — H35371 Puckering of macula, right eye: Secondary | ICD-10-CM

## 2022-01-05 NOTE — Progress Notes (Signed)
01/05/2022     CHIEF COMPLAINT Patient presents for  Chief Complaint  Patient presents with   Retina Evaluation      HISTORY OF PRESENT ILLNESS: Mary Hughes is a 61 y.o. female who presents to the clinic today for:   HPI     Retina Evaluation           Laterality: right eye   Associated Symptoms: Floaters   Context: distance vision, mid-range vision, near vision and computer work         Comments   WIP- Floaters worsening OD. Pt was last seen about 8 weeks ago. Pt stated, "My right eye is my better vision eye. I started noticing the increase in floaters about 3 weeks ago. I cant see as well out of my right eye because of the floaters. If its in the mornings and its dark, I would see a little light at the bottom of my vision in my right eye."         Last edited by Hurman Horn, MD on 01/05/2022  3:03 PM.      Referring physician: Gaynelle Arabian, MD [address],  [place] [address]  HISTORICAL INFORMATION:   Selected notes from the [place]: No current outpatient medications on file. (Ophthalmic Drugs)   No current facility-administered medications for this visit. (Ophthalmic Drugs)   Current Outpatient Medications (Other)  Medication Sig   Chelated Zinc 50 MG TABS    cholecalciferol (VITAMIN D) 1000 UNITS tablet Take 2,000 Units by mouth daily.    Clindamycin-Benzoyl Per, Refr, gel Apply 1 application topically as directed.    hydrochlorothiazide (MICROZIDE) 12.5 MG capsule TAKE 1 CAPSULE BY MOUTH EVERY DAY   levothyroxine (SYNTHROID) 50 MCG tablet Take 1 tablet (50 mcg total) by mouth daily.   lisinopril (ZESTRIL) 20 MG tablet 1 tablet   meloxicam (MOBIC) 15 MG tablet Take 15 mg by mouth daily.   Multiple Vitamin (MULTI VITAMIN DAILY PO) Take by mouth.   No current facility-administered medications for this visit. (Other)      REVIEW OF SYSTEMS: ROS   Negative for:  Constitutional, Gastrointestinal, Neurological, Skin, Genitourinary, Musculoskeletal, HENT, Endocrine, Cardiovascular, Eyes, Respiratory, Psychiatric, Allergic/Imm, Heme/Lymph Last edited by Silvestre Moment on 01/05/2022  2:33 PM.       ALLERGIES Allergies  Allergen Reactions   Estradiol Other (See Comments)    PAST MEDICAL HISTORY Past Medical History:  Diagnosis Date   Hypertension    Ovarian mass 06/2005   RIGHT OVARIAN ECHOGENIC MASS   S/P endometrial ablation 10/07   Thyroid disease    Hypo thyroid   Past Surgical History:  Procedure Laterality Date   COLONOSCOPY  04/03/2012   Procedure: COLONOSCOPY;  Surgeon: Wonda Horner, MD;  Location: [facility];  Service: Endoscopy;  Laterality: N/A;   ENDOMETRIAL ABLATION  04/18/2006   HIP SURGERY  08/2004   LEFT HIP REPLACEMENT   HYSTEROSCOPY  04/18/2006   HYST, D&C WITH NOVASURE   JOINT REPLACEMENT     left hip   PILONIDAL CYST DRAINAGE     Removed    FAMILY HISTORY Family History  Problem Relation Age of Onset   Hypertension Mother    Lung cancer Mother    Heart disease Father    Cancer Father        Throat,Lymphoma   Diabetes Maternal Grandmother    Heart disease Maternal Grandfather  Breast cancer Neg Hx     SOCIAL HISTORY Social History   Tobacco Use   Smoking status: Never   Smokeless tobacco: Never  Vaping Use   Vaping Use: Never used  Substance Use Topics   Alcohol use: Yes    Alcohol/week: 10.0 standard drinks of alcohol    Types: 10 Standard drinks or equivalent per week   Drug use: No         OPHTHALMIC EXAM:  Base Eye Exam     Visual Acuity (ETDRS)       Right Left   Dist [place] 20/20 -1 20/20         Tonometry (Tonopen, 2:37 PM)       Right Left   Pressure 16 18         Pupils       Pupils APD   Right PERRL None   Left PERRL None         Visual Fields       Left Right    Full Full         Extraocular Movement       Right Left    Full Full          Neuro/Psych     Oriented x3: Yes   Mood/Affect: Normal         Dilation     Right eye: 2.5% Phenylephrine, 1.0% Mydriacyl @ 2:37 PM           Slit Lamp and Fundus Exam     External Exam       Right Left   External Normal Normal         Slit Lamp Exam       Right Left   Lids/Lashes Normal Normal   Conjunctiva/Sclera White and quiet White and quiet   Cornea Clear Clear   Anterior Chamber Deep and quiet Deep and quiet   Iris Round and reactive Round and reactive   Lens Centered posterior chamber intraocular lens Centered posterior chamber intraocular lens   Anterior Vitreous Normal clear, avitric         Fundus Exam       Right Left   Posterior Vitreous Posterior vitreous detachment, Central vitreous floaters    Disc With coving of the vessels at the superior pole and inferior pole of nerve    C/D Ratio 0.65    Macula Epiretinal membrane, moderate topographic change    Vessels Normal    Periphery Old retinal tear centered at 11:30 position superotemporally with excellent laser retinopexy no extension no subretinal fluid             IMAGING AND PROCEDURES  Imaging and Procedures for 01/05/22  OCT, Retina - OU - Both Eyes       Right Eye Quality was good. Scan locations included subfoveal. Central Foveal Thickness: 359. Progression has improved. Findings include abnormal foveal contour, epiretinal membrane.   Left Eye Quality was good. Scan locations included subfoveal. Central Foveal Thickness: 268. Progression has been stable. Findings include normal foveal contour.   Notes OD, now with central foveal subtle disruption of the EOM layer and also the photoreceptor layer suggesting symptomatic changes ongoing at the patient is now complaining.      Color Fundus Photography Optos - OU - Both Eyes       Good retinopexy superiorly around retinal break.  Moderate epiretinal membrane.  A attached, vitreous debris persists.  Centrally OD.  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  Macular pucker, right eye OD with progression of thickening and now topographic distorting distorting the inner foveal layers particularly the outer retinal layers nearing the photoreceptor layer now symptomatic as well.  Retinal tear of right eye OD stable     ICD-10-CM   1. 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 Macular pucker, right eye  H35.371     3. Retinal tear of right eye  H33.311       1.  OD with symptomatic visual pack for vitreous membranes and strands but now also with combined with epiretinal membrane triggering distortion and change affecting quality of vision that she can notice.  We will recommend vitrectomy now with epiretinal membrane peel, [id number]  2.  History of retinal tear OD thus will use adjunct of focal laser treatment to the regional area previous treatment and the other areas of traction that are determined are not discovered  3.  Risks and benefits reviewed.  Patient would like to schedule late August 2023  Ophthalmic Meds Ordered this visit:  No orders of the defined types were placed in this encounter.      Return ,, [facility], for Schedule vitrectomy, membrane [phone] and adjunct of focal laser.  There are no Patient Instructions on file for this visit.   Explained the diagnoses, plan, and follow up with the patient and they expressed understanding.  Patient expressed understanding of the importance of proper follow up care.   Clent Demark Yeily Link M.D. Diseases & Surgery of the Retina and Vitreous [facility] [place] 01/05/22     Abbreviations: M myopia (nearsighted); A astigmatism; H hyperopia (farsighted); P presbyopia; Mrx spectacle prescription;  CTL contact lenses; OD right  eye; OS left eye; OU both eyes  XT exotropia; ET esotropia; PEK punctate epithelial keratitis; PEE punctate epithelial erosions; DES dry eye syndrome; MGD meibomian gland dysfunction; ATs artificial tears; PFAT's preservative free artificial tears; [place] nuclear sclerotic cataract; PSC posterior subcapsular cataract; ERM epi-retinal membrane; PVD posterior vitreous detachment; RD retinal detachment; DM diabetes mellitus; DR diabetic retinopathy; NPDR non-proliferative diabetic retinopathy; PDR proliferative diabetic retinopathy; CSME clinically significant macular edema; DME diabetic macular edema; dbh dot blot hemorrhages; CWS cotton wool spot; POAG primary open angle glaucoma; C/D cup-to-disc ratio; HVF humphrey visual field; GVF goldmann visual field; OCT optical coherence tomography; IOP intraocular pressure; BRVO Branch retinal vein occlusion; CRVO central retinal vein occlusion; CRAO central retinal artery occlusion; BRAO branch retinal artery occlusion; RT retinal tear; SB scleral buckle; PPV pars plana vitrectomy; VH Vitreous hemorrhage; PRP panretinal laser photocoagulation; IVK intravitreal kenalog; VMT vitreomacular traction; MH Macular hole;  NVD neovascularization of the disc; NVE neovascularization elsewhere; AREDS age related eye disease study; ARMD age related macular degeneration; POAG primary open angle glaucoma; EBMD epithelial/anterior basement membrane dystrophy; ACIOL anterior chamber intraocular lens; IOL intraocular lens; PCIOL posterior chamber intraocular lens; Phaco/IOL phacoemulsification with intraocular lens placement; [place] photorefractive keratectomy; LASIK laser assisted in situ keratomileusis; HTN hypertension; DM diabetes mellitus; COPD chronic obstructive pulmonary disease

## 2022-01-05 NOTE — Assessment & Plan Note (Signed)
OD stable 

## 2022-01-05 NOTE — Assessment & Plan Note (Signed)
Central vitreous floaters now hampering her ability to function.

## 2022-01-05 NOTE — Assessment & Plan Note (Signed)
OD with progression of thickening and now topographic distorting distorting the inner foveal layers particularly the outer retinal layers nearing the photoreceptor layer now symptomatic as well.

## 2022-01-11 ENCOUNTER — Encounter: Payer: Self-pay | Admitting: Internal Medicine

## 2022-01-11 NOTE — Telephone Encounter (Signed)
Informed pt that Dr. Harrington Challenger and her nurse were out of the office the remainder of this week. Advised that stopping the Lisinopril was correct given the reported symptoms.  Pt will keep monitoring BP for now as advised. Aware after MD  review the nurse will let pt know advisement. Pt aware I will be next week before MD review. Patient verbalized understanding and agreeable to plan.    Mary Hughes -- I did not add Lisinopril to allergy list, but will let you if Dr. Harrington Challenger agrees it should be added)

## 2022-01-16 ENCOUNTER — Encounter (INDEPENDENT_AMBULATORY_CARE_PROVIDER_SITE_OTHER): Payer: Self-pay | Admitting: Ophthalmology

## 2022-01-16 MED ORDER — AMLODIPINE BESYLATE 5 MG PO TABS: 5.0000 mg | ORAL_TABLET | Freq: Every day | ORAL | 3 refills | Status: DC

## 2022-01-16 NOTE — Telephone Encounter (Signed)
Spoke to patient   Off if lisinopril  Add this to allergy list (angioedema)  Recomm:  Amlodipine 5 mg     Call in to [facility]  She will continue HCTZ     Will write in with BP readings in a few wks

## 2022-01-17 ENCOUNTER — Emergency Department (HOSPITAL_COMMUNITY)
Admission: EM | Admit: 2022-01-17 | Discharge: 2022-01-17 | Disposition: A | Discharge status: Home / Self Care | Payer: BC Managed Care – PPO | Attending: Emergency Medicine | Admitting: Emergency Medicine

## 2022-01-17 ENCOUNTER — Other Ambulatory Visit: Payer: Self-pay

## 2022-01-17 ENCOUNTER — Emergency Department (HOSPITAL_COMMUNITY): Payer: BC Managed Care – PPO

## 2022-01-17 ENCOUNTER — Other Ambulatory Visit (HOSPITAL_COMMUNITY): Payer: Self-pay

## 2022-01-17 ENCOUNTER — Encounter (HOSPITAL_COMMUNITY): Payer: Self-pay

## 2022-01-17 DIAGNOSIS — R202 Paresthesia of skin: Secondary | ICD-10-CM | POA: Diagnosis not present

## 2022-01-17 DIAGNOSIS — E876 Hypokalemia: Secondary | ICD-10-CM | POA: Diagnosis not present

## 2022-01-17 DIAGNOSIS — I10 Essential (primary) hypertension: Secondary | ICD-10-CM | POA: Insufficient documentation

## 2022-01-17 DIAGNOSIS — R9431 Abnormal electrocardiogram [ECG] [EKG]: Secondary | ICD-10-CM | POA: Diagnosis not present

## 2022-01-17 DIAGNOSIS — Z20822 Contact with and (suspected) exposure to covid-19: Secondary | ICD-10-CM | POA: Diagnosis not present

## 2022-01-17 DIAGNOSIS — R200 Anesthesia of skin: Secondary | ICD-10-CM | POA: Diagnosis not present

## 2022-01-17 DIAGNOSIS — M4802 Spinal stenosis, cervical region: Secondary | ICD-10-CM

## 2022-01-17 DIAGNOSIS — M47812 Spondylosis without myelopathy or radiculopathy, cervical region: Secondary | ICD-10-CM | POA: Diagnosis not present

## 2022-01-17 LAB — CBC WITH DIFFERENTIAL/PLATELET
Abs Immature Granulocytes: 0.02 10*3/uL (ref 0.00–0.07)
Basophils Absolute: 0.1 10*3/uL (ref 0.0–0.1)
Basophils Relative: 1 %
Eosinophils Absolute: 0.2 10*3/uL (ref 0.0–0.5)
Eosinophils Relative: 3 %
HCT: 36.7 % (ref 36.0–46.0)
Hemoglobin: 12.8 g/dL (ref 12.0–15.0)
Immature Granulocytes: 0 %
Lymphocytes Relative: 33 %
Lymphs Abs: 2 10*3/uL (ref 0.7–4.0)
MCH: 33.5 pg (ref 26.0–34.0)
MCHC: 34.9 g/dL (ref 30.0–36.0)
MCV: 96.1 fL (ref 80.0–100.0)
Monocytes Absolute: 0.7 10*3/uL (ref 0.1–1.0)
Monocytes Relative: 11 %
Neutro Abs: 3.1 10*3/uL (ref 1.7–7.7)
Neutrophils Relative %: 52 %
Platelets: 278 10*3/uL (ref 150–400)
RBC: 3.82 MIL/uL — ABNORMAL LOW (ref 3.87–5.11)
RDW: 13.7 % (ref 11.5–15.5)
WBC: 6 10*3/uL (ref 4.0–10.5)
nRBC: 0 % (ref 0.0–0.2)

## 2022-01-17 LAB — TSH: TSH: 2.93 u[IU]/mL (ref 0.350–4.500)

## 2022-01-17 LAB — COMPREHENSIVE METABOLIC PANEL
ALT: 34 U/L (ref 0–44)
AST: 41 U/L (ref 15–41)
Albumin: 4.1 g/dL (ref 3.5–5.0)
Alkaline Phosphatase: 80 U/L (ref 38–126)
Anion gap: 11 (ref 5–15)
BUN: 16 mg/dL (ref 6–20)
CO2: 22 mmol/L (ref 22–32)
Calcium: 9.3 mg/dL (ref 8.9–10.3)
Chloride: 105 mmol/L (ref 98–111)
Creatinine, Ser: 0.63 mg/dL (ref 0.44–1.00)
GFR, Estimated: >60 mL/min (ref >60)
Glucose, Bld: 99 mg/dL (ref 70–99)
Potassium: 3.3 mmol/L — ABNORMAL LOW (ref 3.5–5.1)
Sodium: 138 mmol/L (ref 135–145)
Total Bilirubin: 0.7 mg/dL (ref 0.3–1.2)
Total Protein: 7.6 g/dL (ref 6.5–8.1)

## 2022-01-17 LAB — T4, FREE: Free T4: 0.71 ng/dL (ref 0.61–1.12)

## 2022-01-17 LAB — MAGNESIUM: Magnesium: 1.6 mg/dL — ABNORMAL LOW (ref 1.7–2.4)

## 2022-01-17 LAB — TROPONIN I (HIGH SENSITIVITY): Troponin I (High Sensitivity): 5 ng/L (ref <18)

## 2022-01-17 LAB — PROTIME-INR
INR: 1 (ref 0.8–1.2)
Prothrombin Time: 13 seconds (ref 11.4–15.2)

## 2022-01-17 LAB — SARS CORONAVIRUS 2 BY RT PCR: SARS Coronavirus 2 by RT PCR: NEGATIVE

## 2022-01-17 MED ORDER — MAGNESIUM OXIDE 400 MG PO TABS: 200.0000 mg | ORAL_TABLET | Freq: Every day | ORAL | 0 refills | Status: AC

## 2022-01-17 MED ORDER — POTASSIUM CHLORIDE 20 MEQ PO PACK
20.0000 meq | PACK | Freq: Once | ORAL | Status: AC
Start: 2022-01-17 — End: 2022-01-17
  Administered 2022-01-17: 20 meq via ORAL
  Filled 2022-01-17: qty 1

## 2022-01-17 MED ORDER — DEXAMETHASONE SODIUM PHOSPHATE 10 MG/ML IJ SOLN
10.0000 mg | Freq: Once | INTRAMUSCULAR | Status: AC
Start: 2022-01-17 — End: 2022-01-17
  Administered 2022-01-17: 10 mg via INTRAVENOUS
  Filled 2022-01-17: qty 1

## 2022-01-17 NOTE — ED Notes (Signed)
Patient transported to MRI via MRI staff.

## 2022-01-17 NOTE — Discharge Instructions (Signed)
You were seen in the emergency department today for numbness that has since resolved.  Overall, your work-up including blood work, EKG and MRI of the brain and neck were reassuring.  You had some chronic degenerative changes in your neck but no concerning findings.  I have given you neurosurgery information in case you have any new severe neck pain or weakness or numbness in your arm.  Additionally, your potassium and magnesium levels were slightly low.  I have given you a short course of magnesium pills to take for the next 5 days.  Make sure you follow-up with your doctor in the next 1 to 2 weeks for blood work recheck.  Come back for any severe weakness that is new, slurred speech, loss of sensation, chest pain or any other symptoms concerning to you.

## 2022-01-17 NOTE — ED Provider Notes (Signed)
[facility] EMERGENCY DEPARTMENT Provider Note   CSN: [id number] Arrival date & time: 01/17/22  1152     History  Chief Complaint  Patient presents with   Numbness    Mary Hughes is a 61 y.o. female.  With PMH of Hashimoto's thyroiditis, anemia, glaucoma presenting with numbness of her left side of her body that happened approximately 30 minutes prior to arrival at the ER with no associated other neurologic deficits when she was taking her blood pressure at home and had noticed how high it was.  Patient says the symptoms resolved within minutes.  She had no associated symptoms, no slurred speech, no headache, no altered mental status, no focal weakness, no complete loss of sensation just feeling like she was slightly number on the left side.  She says she was stressed by her high blood pressure which is high because she was recently changed from lisinopril to amlodipine for lip swelling that she was experiencing from lisinopril.  She has no other symptoms such as tongue swelling, no shortness of breath, no chest pain.  No history of heart attack or stroke.  HPI     Home Medications Prior to Admission medications   Medication Sig Start Date End Date Taking? Authorizing Provider  magnesium oxide (MAG-OX) 400 MG tablet Take 0.5 tablets (200 mg total) by mouth daily for 5 days. 01/17/22 01/22/22 Yes Elgie Congo, MD  amLODipine (NORVASC) 5 MG tablet Take 1 tablet (5 mg total) by mouth daily. 01/16/22   Fay Records, MD  Chelated Zinc 50 MG TABS  09/07/21   [provider]  cholecalciferol (VITAMIN D) 1000 UNITS tablet Take 2,000 Units by mouth daily.     [provider]  Clindamycin-Benzoyl Per, Refr, gel Apply 1 application topically as directed.  06/30/19   [provider]  hydrochlorothiazide (MICROZIDE) 12.5 MG capsule TAKE 1 CAPSULE BY MOUTH EVERY DAY 12/08/21   Fay Records, MD  levothyroxine (SYNTHROID) 50 MCG tablet Take 1 tablet (50  mcg total) by mouth daily. 10/11/21   Philemon Kingdom, MD  meloxicam (MOBIC) 15 MG tablet Take 15 mg by mouth daily. 09/02/21   [provider]  Multiple Vitamin (MULTI VITAMIN DAILY PO) Take by mouth.    [provider]      Allergies    Lisinopril and Estradiol    Review of Systems   Review of Systems  Physical Exam Updated Vital Signs BP (!) 170/104   Pulse 78   Temp 97.9 F (36.6 C) (Oral)   Resp 19   Ht '5\' 6"'$  (1.676 m)   Wt 63.5 kg   SpO2 100%   BMI 22.60 kg/m  Physical Exam Constitutional: Alert and oriented. Well appearing and in no distress. Eyes: Conjunctivae are normal. ENT      Head: Normocephalic and atraumatic.      Nose: No congestion.      Mouth/Throat: Mucous membranes are moist.  No angioedema appreciated on exam.      Neck: No stridor. Cardiovascular: S1, S2,  Normal and symmetric distal pulses are present in all extremities.Warm and well perfused. Respiratory: Normal respiratory effort. Breath sounds are normal.  O2 sat 100 on RA. Gastrointestinal: Soft and nontender. There is no CVA tenderness. Musculoskeletal: Normal range of motion in all extremities.      Right lower leg: No tenderness or edema.      Left lower leg: No tenderness or edema. Neurologic: Normal speech and language without aphasia.  AAOx4. PERRL. EOMI without nystagmus. Face symmetrical without droop. CN II-XII intact. Normal facial sensation. Tongue midline. No pronator drift. 5/5 strength in upper and lower extremities. Normal sensation to light touch in all extremities. Normal finger-to-nose. Normal gait. {No focal neurologic deficits are appreciated. Skin: Skin is warm, dry and intact. No rash noted. Psychiatric: Mood and affect are normal. Speech and behavior are normal.   ED Results / Procedures / Treatments   Labs (all labs ordered are listed, but only abnormal results are displayed) Labs Reviewed  CBC WITH DIFFERENTIAL/PLATELET - Abnormal; Notable for the  following components:      Result Value   RBC 3.82 (*)    All other components within normal limits  COMPREHENSIVE METABOLIC PANEL - Abnormal; Notable for the following components:   Potassium 3.3 (*)    All other components within normal limits  MAGNESIUM - Abnormal; Notable for the following components:   Magnesium 1.6 (*)    All other components within normal limits  SARS CORONAVIRUS 2 BY RT PCR  PROTIME-INR  TSH  T4, FREE  T3, FREE  TROPONIN I (HIGH SENSITIVITY)    EKG EKG Interpretation  Date/Time:  Tuesday January 17 2022 12:17:06 EDT Ventricular Rate:  83 PR Interval:  156 QRS Duration: 74 QT Interval:  398 QTC Calculation: 467 R Axis:   71 Text Interpretation: Normal sinus rhythm Normal ECG No previous ECGs available Confirmed by Georgina Snell ([id number]) on 01/17/2022 4:23:40 PM  Radiology MR Cervical Spine Wo Contrast  Result Date: 01/17/2022 CLINICAL DATA:  Numbness/paresthesia.  Left-sided numbness. EXAM: MRI CERVICAL SPINE WITHOUT CONTRAST TECHNIQUE: Multiplanar, multisequence MR imaging of the cervical spine was performed. No intravenous contrast was administered. COMPARISON:  None Available. FINDINGS: Alignment: Straightening of cervical lordosis with mild C2-3 anterolisthesis Vertebrae: Marrow edema at the left C2-3 facet which is degenerated. No fracture or aggressive bone lesion Cord: Normal signal and morphology. Posterior Fossa, vertebral arteries, paraspinal tissues: Negative. Disc levels: C2-3: Facet osteoarthritis with greater spurring on the left. Mild anterolisthesis. Moderate left foraminal narrowing C3-4: Disc narrowing and bulging with uncovertebral and facet spurring eccentric to the left where there is foraminal impingement C4-5: Disc narrowing and bulging with uncovertebral ridging greater on the left where there is moderate foraminal stenosis. C5-6: Minor disc bulging C6-7: Disc bulging and leftward endplate degeneration with bulky left uncovertebral spur  and left foraminal impingement C7-T1:Degenerative facet spurring asymmetric to the left. No neural impingement IMPRESSION: 1. Cervical spine degeneration especially affecting left-sided facet and uncovertebral joints with left foraminal stenosis at C2-3 to C6-7, sparing C5-6. Left foraminal impingement is greatest at C6-7. 2. Diffusely patent spinal canal and right-sided foramina. 3. Active facet arthritis on the left at C2-3. Electronically Signed   By: Jorje Guild M.D.   On: 01/17/2022 18:41   MR BRAIN WO CONTRAST  Result Date: 01/17/2022 CLINICAL DATA:  TIA.  Left-sided numbness EXAM: MRI HEAD WITHOUT CONTRAST TECHNIQUE: Multiplanar, multiecho pulse sequences of the brain and surrounding structures were obtained without intravenous contrast. COMPARISON:  CT head 01/17/2022 FINDINGS: Brain: Ventricle size and cerebral volume normal. Negative for acute infarct. Several small white matter hyperintensities. Chronic microhemorrhage left frontal lobe. Otherwise no hemorrhage. No mass lesion. Vascular: Normal arterial flow voids at the skull base Skull and upper cervical spine: No focal skeletal lesion. Sinuses/Orbits: Mild mucosal edema and air-fluid level left maxillary sinus. Bilateral cataract extraction Other: None IMPRESSION: No acute intracranial abnormality. Mild white matter changes likely due to chronic ischemia. Electronically Signed  By: Franchot Gallo M.D.   On: 01/17/2022 18:15   CT HEAD WO CONTRAST (5MM)  Result Date: 01/17/2022 CLINICAL DATA:  Neuro deficit, acute, stroke suspected. Additional history provided: Left-sided numbness. EXAM: CT HEAD WITHOUT CONTRAST TECHNIQUE: Contiguous axial images were obtained from the base of the skull through the vertex without intravenous contrast. RADIATION DOSE REDUCTION: This exam was performed according to the departmental dose-optimization program which includes automated exposure control, adjustment of the mA and/or kV according to patient size and/or  use of iterative reconstruction technique. COMPARISON:  Report from head CT 12/20/2002 (images unavailable). FINDINGS: Brain: Mild generalized parenchymal atrophy. There is no acute intracranial hemorrhage. No demarcated cortical infarct. No extra-axial fluid collection. No evidence of an intracranial mass. No midline shift. Vascular: No hyperdense vessel. Atherosclerotic calcifications. Skull: No fracture or aggressive osseous lesion. Sinuses/Orbits: No mass or acute finding within the imaged orbits. Trace mucosal thickening within the bilateral maxillary sinuses at the imaged levels. Superimposed small fluid level within the left maxillary sinus. Minimal mucosal thickening also present within the bilateral ethmoid sinuses. IMPRESSION: No evidence of acute intracranial abnormality. Mild generalized parenchymal atrophy. Mild paranasal sinus disease at the imaged levels, as described. Electronically Signed   By: Kellie Simmering D.O.   On: 01/17/2022 13:09    Procedures Procedures  Remained on cardiac monitoring, normal sinus rhythm.  Medications Ordered in ED Medications  potassium chloride (KLOR-CON) packet 20 mEq (20 mEq Oral Given 01/17/22 1858)  dexamethasone (DECADRON) injection 10 mg (10 mg Intravenous Given 01/17/22 1858)    ED Course/ Medical Decision Making/ A&P Clinical Course as of 01/17/22 2350  Tue Jan 17, 2022  2003 Patient's MRI brain reassuring with no evidence of stroke.  MRI C-spine with changes, no cord impingement, evidence of cervical spinal stenosis but no pain.  Provided with neurosurgery information if she has any further issues or new neck pain.  Mild hypomagnesemia 1.6 and mild hypokalemia 3.3.  Given potassium repletion here.  EKG reassuring with no ischemic changes and reassuring high since he troponin 5.  Normal TSH.  Safe for discharge home follow-up with PCP.  Strict return precautions discussed.  Discharged with short course of oral magnesium as well as potassium replacement.   Strict return precaution discussed.  She is in agreement with plan. [VB]    Clinical Course User Index [VB] Elgie Congo, MD                           Medical Decision Making Mary Hughes is a 61 y.o. female.  With PMH of Hashimoto's thyroiditis, anemia, glaucoma presenting with numbness of her left side of her body that happened approximately 30 minutes prior to arrival at the ER with no associated other neurologic deficits when she was taking her blood pressure at home and had noticed how high it was.  Patient presents with vague complaint of numbness after stressful event earlier today.  She presents hypertensive 177/105 but otherwise asymptomatic at time of arrival with NIH stroke scale of 0 and no focal neurologic deficits.  Suspect most likely acute stress event versus electrolyte abnormality versus thyroid abnormality.  Very low suspicion for CVA or ICH with no pain, no focal neurologic deficits but with her age and risk factors, will pursue MRI head and neck.  She has had a CT head upon arrival with no evidence of ICH or acute traumatic injuries.  Also consider acute electrolyte abnormalities as she has mild  hypokalemia upon arrival 3.3, ordered for repletion.  Reassuring EKG normal sinus rhythm with no ischemic changes and normal high sensitive troponin, no concern for atypical ACS.  Additionally, no focal neurologic deficits, no pulse deficits and overall well appearance with no chest pain or abdominal pain or any symptoms concerning for aortic dissection.  Added on thyroid markers and magnesium as well.  Additionally, patient notes having lipedema although not appreciated on exam from lisinopril use.  No appreciated angioedema or respiratory distress on exam.  Gave 1 dose of IV Decadron 10 mg with no history of diabetes and she is recently discontinued the lisinopril.  Repleted potassium.  Disposition pending MRI.  Amount and/or Complexity of Data Reviewed Independent Historian:  spouse Labs: ordered. Decision-making details documented in ED Course. Radiology: ordered and independent interpretation performed. Decision-making details documented in ED Course.    Details: No ICH on independent interpretation of CT head. ECG/medicine tests:  Decision-making details documented in ED Course.  Risk OTC drugs. Prescription drug management.    Final Clinical Impression(s) / ED Diagnoses Final diagnoses:  Numbness  Hypokalemia  Hypomagnesemia  Cervical stenosis of spine    Rx / DC Orders ED Discharge Orders          Ordered    magnesium oxide (MAG-OX) 400 MG tablet  Daily        01/17/22 2005              Elgie Congo, MD 01/17/22 2350

## 2022-01-17 NOTE — ED Triage Notes (Signed)
PT arrived POV from home c/o left sided numbness that started 30 mins prior to arrival. Pt states over the weekend she had numbness and swelling in her lips. Pt states her LKW was Friday 01/13/2022.

## 2022-01-17 NOTE — ED Provider Triage Note (Addendum)
Emergency Medicine Provider Triage Evaluation Note  Mary Hughes , a 61 y.o. female  was evaluated in triage.  Pt complains of numbness and feeling unwell.  Over the weekend developed congestion, tingling and swelling to her lips.  She had generalized weakness.  She thought she was getting sick.  Numbness and tingling resolved.  Yesterday she thought her lips are even more swollen.  Today she developed left-sided tingling.  No facial droop, difficulty with word finding.  Last known normal on Friday.  No prior history of CVA.  No vision changes.  No new lotions, perfumes, detergents, history of angioedema or anaphylaxis  Review of Systems  Positive: Numbness, weakness, facial swelling Negative:   Physical Exam  BP (!) 176/102 (BP Location: Right Arm)   Pulse 84   Temp (!) 97.4 F (36.3 C) (Oral)   Resp 18   Ht '5\' 6"'$  (1.676 m)   Wt 63.5 kg   SpO2 100%   BMI 22.60 kg/m  Gen:   Awake, no distress   Resp:  Normal effort, no facial swelling, no angioedema MSK:   Moves extremities without difficulty  Neuro:  Subjective decrease sensation to left upper extremity, left lower extremity, left face.  Equal handgrip.  No slurred speech, no vision changes, CN 2-12 grossly intact Other:    Medical Decision Making  Medically screening exam initiated at 12:12 PM.  Appropriate orders placed.  Mary Hughes was informed that the remainder of the evaluation will be completed by another provider, this initial triage assessment does not replace that evaluation, and the importance of remaining in the ED until their evaluation is complete.  Numbness, feeling unwell  No LVO criteria Last known normal on Friday, not a code stroke   Allexis Bordenave A, PA-C 01/17/22 1215    Ken Bonn A, PA-C 01/17/22 1215

## 2022-01-18 DIAGNOSIS — H40013 Open angle with borderline findings, low risk, bilateral: Secondary | ICD-10-CM | POA: Diagnosis not present

## 2022-01-18 LAB — T3, FREE: T3, Free: 2.6 pg/mL (ref 2.0–4.4)

## 2022-01-19 ENCOUNTER — Telehealth: Payer: Self-pay | Admitting: Internal Medicine

## 2022-01-19 DIAGNOSIS — Z79899 Other long term (current) drug therapy: Secondary | ICD-10-CM

## 2022-01-19 DIAGNOSIS — E785 Hyperlipidemia, unspecified: Secondary | ICD-10-CM

## 2022-01-19 DIAGNOSIS — I10 Essential (primary) hypertension: Secondary | ICD-10-CM

## 2022-01-19 NOTE — Telephone Encounter (Signed)
Reviewed resutls from recent ER visit  1   With atherosclerosis would recomm Crestor 10 mg daily   Get lipomed in 8 wks   with liver panel   Check Vit D then if not checked recently as well as BMET  2.  Pt interested in CT calcium score   Please schedule 3  BP today still high   Would change amlodipine t o5 bid    Stop HCTZ and switch to Maxzide 37.5/25  Start at 1/2 tab daily

## 2022-01-20 MED ORDER — AMLODIPINE BESYLATE 5 MG PO TABS: 5.0000 mg | ORAL_TABLET | Freq: Two times a day (BID) | ORAL | 3 refills | Status: DC

## 2022-01-20 MED ORDER — ROSUVASTATIN CALCIUM 10 MG PO TABS: 10.0000 mg | ORAL_TABLET | Freq: Every day | ORAL | 3 refills | Status: DC

## 2022-01-20 MED ORDER — TRIAMTERENE-HCTZ 37.5-25 MG PO TABS: 0.5000 | ORAL_TABLET | Freq: Every day | ORAL | 3 refills | Status: DC

## 2022-01-20 NOTE — Addendum Note (Signed)
Addended by: Stephani Police on: 01/20/2022 09:42 AM   Modules accepted: Orders

## 2022-01-20 NOTE — Telephone Encounter (Signed)
Pt advised Dr Harrington Challenger' recommendations and verbalized understanding... she wants to wait a short time to start the Crestor since she is making other med changes but she will let me know when she starts it so we can plan her 8 week labs... it if gets too long she will let me know and will come in for the non lipid labs at least.   I will call and follow up with her on Tuesday 01/24/22.

## 2022-01-24 NOTE — Telephone Encounter (Signed)
I called the pt to see how her BP has been doing.. she says she did not have her readings there in front of her but it has been doing very well at 119/82 and today it was 128/91 but she forgot to take the evening Amlodipine.... she says she has ben feeling well and will keep track and let us know so more readings om a few days..  She says she is still to ready to commit to taking Crestor she says she will continue to think about it and let us know when she starts it to plan for labs in 8 weeks but for now she will come in 02/10/22 for her BMET and VIT D level.

## 2022-02-01 ENCOUNTER — Ambulatory Visit (INDEPENDENT_AMBULATORY_CARE_PROVIDER_SITE_OTHER): Payer: BC Managed Care – PPO

## 2022-02-01 MED ORDER — PREDNISOLONE ACETATE 1 % OP SUSP: 1.0000 [drp] | Freq: Four times a day (QID) | OPHTHALMIC | 0 refills | Status: AC

## 2022-02-01 MED ORDER — OFLOXACIN 0.3 % OP SOLN: 1.0000 [drp] | Freq: Four times a day (QID) | OPHTHALMIC | 0 refills | Status: AC

## 2022-02-09 ENCOUNTER — Ambulatory Visit (HOSPITAL_BASED_OUTPATIENT_CLINIC_OR_DEPARTMENT_OTHER)
Admission: RE | Admit: 2022-02-09 | Discharge: 2022-02-09 | Disposition: A | Discharge status: Home / Self Care | Payer: BC Managed Care – PPO | Source: Ambulatory Visit | Attending: Internal Medicine | Admitting: Internal Medicine

## 2022-02-09 ENCOUNTER — Encounter (HOSPITAL_BASED_OUTPATIENT_CLINIC_OR_DEPARTMENT_OTHER): Payer: Self-pay

## 2022-02-09 DIAGNOSIS — I10 Essential (primary) hypertension: Secondary | ICD-10-CM | POA: Insufficient documentation

## 2022-02-09 DIAGNOSIS — E785 Hyperlipidemia, unspecified: Secondary | ICD-10-CM | POA: Insufficient documentation

## 2022-02-09 DIAGNOSIS — Z79899 Other long term (current) drug therapy: Secondary | ICD-10-CM | POA: Insufficient documentation

## 2022-02-10 ENCOUNTER — Other Ambulatory Visit: Payer: BC Managed Care – PPO

## 2022-02-10 DIAGNOSIS — Z79899 Other long term (current) drug therapy: Secondary | ICD-10-CM

## 2022-02-10 DIAGNOSIS — E785 Hyperlipidemia, unspecified: Secondary | ICD-10-CM

## 2022-02-10 DIAGNOSIS — I10 Essential (primary) hypertension: Secondary | ICD-10-CM

## 2022-02-11 LAB — BASIC METABOLIC PANEL
BUN/Creatinine Ratio: 25 — ABNORMAL HIGH (ref 9–23)
BUN: 17 mg/dL (ref 6–24)
CO2: 24 mmol/L (ref 20–29)
Calcium: 10 mg/dL (ref 8.7–10.2)
Chloride: 100 mmol/L (ref 96–106)
Creatinine, Ser: 0.69 mg/dL (ref 0.57–1.00)
Glucose: 112 mg/dL — ABNORMAL HIGH (ref 70–99)
Potassium: 3.8 mmol/L (ref 3.5–5.2)
Sodium: 140 mmol/L (ref 134–144)
eGFR: 100 mL/min/{1.73_m2} (ref >59)

## 2022-02-11 LAB — VITAMIN D 25 HYDROXY (VIT D DEFICIENCY, FRACTURES): Vit D, 25-Hydroxy: 86.5 ng/mL (ref 30.0–100.0)

## 2022-02-13 ENCOUNTER — Telehealth: Payer: Self-pay

## 2022-02-13 DIAGNOSIS — E785 Hyperlipidemia, unspecified: Secondary | ICD-10-CM

## 2022-02-13 DIAGNOSIS — Z79899 Other long term (current) drug therapy: Secondary | ICD-10-CM

## 2022-02-13 NOTE — Telephone Encounter (Signed)
-----   Message from Fay Records, MD sent at 02/09/2022  9:26 PM EDT ----- Revieweed with pt   I would recomm starting Crestor  She has filled but hasnt started Will start She will need lipomed in 8 wks with liver panel BP is labile   100s to 140s    She is coming in for labs tomorrow Told her to  continue to record and send readings

## 2022-02-13 NOTE — Telephone Encounter (Signed)
Sent a My Chart for the pt to let me know date for labs in 8 weeks.

## 2022-02-15 ENCOUNTER — Encounter (AMBULATORY_SURGERY_CENTER): Payer: BC Managed Care – PPO | Admitting: Ophthalmology

## 2022-02-15 DIAGNOSIS — H43311 Vitreous membranes and strands, right eye: Secondary | ICD-10-CM

## 2022-02-15 DIAGNOSIS — H35371 Puckering of macula, right eye: Secondary | ICD-10-CM

## 2022-02-16 ENCOUNTER — Ambulatory Visit (INDEPENDENT_AMBULATORY_CARE_PROVIDER_SITE_OTHER): Payer: BC Managed Care – PPO | Admitting: Ophthalmology

## 2022-02-16 ENCOUNTER — Encounter (INDEPENDENT_AMBULATORY_CARE_PROVIDER_SITE_OTHER): Payer: Self-pay | Admitting: Ophthalmology

## 2022-02-16 DIAGNOSIS — H43311 Vitreous membranes and strands, right eye: Secondary | ICD-10-CM

## 2022-02-16 DIAGNOSIS — H35371 Puckering of macula, right eye: Secondary | ICD-10-CM

## 2022-02-16 NOTE — Progress Notes (Addendum)
02/16/2022     CHIEF COMPLAINT Patient presents for  Chief Complaint  Patient presents with   Post-op Follow-up      HISTORY OF PRESENT ILLNESS: Mary Hughes is a 61 y.o. female who presents to the clinic today for:     Referring physician: Gaynelle Arabian, MD [address] [place],  [place] [address]  HISTORICAL INFORMATION:   Selected notes from the MEDICAL RECORD NUMBER       CURRENT MEDICATIONS: Current Outpatient Medications (Ophthalmic Drugs)  Medication Sig   ofloxacin (OCUFLOX) 0.3 % ophthalmic solution Place 1 drop into the right eye 4 (four) times daily for 21 days.   prednisoLONE acetate (PRED FORTE) 1 % ophthalmic suspension Place 1 drop into the right eye 4 (four) times daily for 21 days.   No current facility-administered medications for this visit. (Ophthalmic Drugs)   Current Outpatient Medications (Other)  Medication Sig   amLODipine (NORVASC) 5 MG tablet Take 1 tablet (5 mg total) by mouth in the morning and at bedtime.   Chelated Zinc 50 MG TABS    cholecalciferol (VITAMIN D) 1000 UNITS tablet Take 2,000 Units by mouth daily.    Clindamycin-Benzoyl Per, Refr, gel Apply 1 application topically as directed.    levothyroxine (SYNTHROID) 50 MCG tablet Take 1 tablet (50 mcg total) by mouth daily.   meloxicam (MOBIC) 15 MG tablet Take 15 mg by mouth daily.   Multiple Vitamin (MULTI VITAMIN DAILY PO) Take by mouth.   rosuvastatin (CRESTOR) 10 MG tablet Take 1 tablet (10 mg total) by mouth daily.   triamterene-hydrochlorothiazide (MAXZIDE-25) 37.5-25 MG tablet Take 0.5 tablets by mouth daily.   No current facility-administered medications for this visit. (Other)      REVIEW OF SYSTEMS:    ALLERGIES Allergies  Allergen Reactions   Lisinopril Swelling    Swelling of lips   Estradiol Other (See Comments)    PAST MEDICAL HISTORY Past Medical History:  Diagnosis Date   Hypertension    Ovarian mass 06/2005   RIGHT OVARIAN  ECHOGENIC MASS   S/P endometrial ablation 10/07   Thyroid disease    Hypo thyroid   Past Surgical History:  Procedure Laterality Date   COLONOSCOPY  04/03/2012   Procedure: COLONOSCOPY;  Surgeon: Wonda Horner, MD;  Location: [facility];  Service: Endoscopy;  Laterality: N/A;   ENDOMETRIAL ABLATION  04/18/2006   HIP SURGERY  08/2004   LEFT HIP REPLACEMENT   HYSTEROSCOPY  04/18/2006   HYST, D&C WITH NOVASURE   JOINT REPLACEMENT     left hip   PILONIDAL CYST DRAINAGE     Removed    FAMILY HISTORY Family History  Problem Relation Age of Onset   Hypertension Mother    Lung cancer Mother    Heart disease Father    Cancer Father        Throat,Lymphoma   Diabetes Maternal Grandmother    Heart disease Maternal Grandfather    Breast cancer Neg Hx     SOCIAL HISTORY Social History   Tobacco Use   Smoking status: Never   Smokeless tobacco: Never  Vaping Use   Vaping Use: Never used  Substance Use Topics   Alcohol use: Yes    Alcohol/week: 10.0 standard drinks of alcohol    Types: 10 Standard drinks or equivalent per week   Drug use: No         OPHTHALMIC EXAM:  Base Eye Exam     Visual Acuity (ETDRS)  Right Left   Dist [place] 20/60 20/20   Dist ph [place] 20/30 -2          Tonometry (Tonopen, 8:48 AM)       Right Left   Pressure 9 18         Pupils       Pupils APD   Right PERRL    Left PERRL None         Visual Fields       Left Right    Full Full         Extraocular Movement       Right Left    Full, Ortho Full, Ortho         Neuro/Psych     Oriented x3: Yes   Mood/Affect: Normal           Slit Lamp and Fundus Exam     External Exam       Right Left   External Normal Normal         Slit Lamp Exam       Right Left   Lids/Lashes Normal Normal   Conjunctiva/Sclera Trace Chemosis, 2+ Injection White and quiet   Cornea Clear Clear   Anterior Chamber Deep and quiet Deep and quiet   Iris dilated Round and  reactive   Lens Centered posterior chamber intraocular lens Centered posterior chamber intraocular lens   Anterior Vitreous Normal clear, avitric         Fundus Exam       Right Left   Posterior Vitreous Clear, avitric    Disc With coving of the vessels at the superior pole and inferior pole of nerve    C/D Ratio 0.65    Macula Typical post ILM removal changes    Vessels Normal    Periphery Old retinal tear centered at 11:30 position superotemporally with excellent laser retinopexy no extension no subretinal fluid, A. 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 Macular pucker, right eye  H35.371       1.  2.  3.  Ophthalmic Meds Ordered this visit:  No orders of the defined types were placed in this encounter.      Return in about 1 week (around 02/23/2022) for dilate, OD, POST OP, OCT, patient may also be seen in 10 to 14 days.  Patient Instructions  Ofloxacin  4 times daily to the operative eye  Prednisolone acetate 1 drop to the operative eye 4 times daily  Patient instructed not to refill the medications and use them for maximum of 3 weeks.  Patient instructed do not rub the eye.  Patient has the option to use the patch at night.    No lifting and bending for 1 week. No water IN the eye for 10 days. Do not rub the eye. Wear shield at night for 1-3 days.  Continue your topical medications for a total of 3 weeks.  Do not refill your postoperative medications unless instructed.  Refrain from exercise or intentional activity which increases our heart rate above resting levels.  Normal walking to complete normal activities of your day are appropriate.  Driving:  Legally,  you only need one good eye, of 20/40 or better to drive.  However, the practice does not recommend driving during first weeks after surgery, IF you are uncomfortable with your visual functioning or capabilities.   If you have known sleep apnea, wear your CPAP as you normally should.   Explained the diagnoses, plan, and follow up with the patient and they expressed understanding.  Patient expressed understanding of the importance of proper follow up care.   Clent Demark Marieta Markov M.D. Diseases & Surgery of the Retina and Vitreous Retina [facility] Diabetic [facility] 02/16/22     Abbreviations: M myopia (nearsighted); A astigmatism; H hyperopia (farsighted); P presbyopia; Mrx spectacle prescription;  CTL contact lenses; OD right eye; OS left eye; OU both eyes  XT exotropia; ET esotropia; PEK punctate epithelial keratitis; PEE punctate epithelial erosions; DES dry eye syndrome; MGD meibomian gland dysfunction; ATs artificial tears; PFAT's preservative free artificial tears; [place] nuclear sclerotic cataract; PSC posterior subcapsular cataract; ERM epi-retinal membrane; PVD posterior vitreous detachment; RD retinal detachment; DM diabetes mellitus; DR diabetic retinopathy; NPDR non-proliferative diabetic retinopathy; PDR proliferative diabetic retinopathy; CSME clinically significant macular edema; DME diabetic macular edema; dbh dot blot hemorrhages; CWS cotton wool spot; POAG primary open angle glaucoma; C/D cup-to-disc ratio; HVF humphrey visual field; GVF goldmann visual field; OCT optical coherence tomography; IOP intraocular pressure; BRVO Branch retinal vein occlusion; CRVO central retinal vein occlusion; CRAO central retinal artery occlusion; BRAO branch retinal artery occlusion; RT retinal tear; SB scleral buckle; PPV pars plana vitrectomy; VH Vitreous hemorrhage; PRP panretinal laser photocoagulation; IVK intravitreal kenalog; VMT vitreomacular traction; MH Macular hole;  NVD neovascularization of the disc; NVE  neovascularization elsewhere; AREDS age related eye disease study; ARMD age related macular degeneration; POAG primary open angle glaucoma; EBMD epithelial/anterior basement membrane dystrophy; ACIOL anterior chamber intraocular lens; IOL intraocular lens; PCIOL posterior chamber intraocular lens; Phaco/IOL phacoemulsification with intraocular lens placement; [place] photorefractive keratectomy; LASIK laser assisted in situ keratomileusis; HTN hypertension; DM diabetes mellitus; COPD chronic obstructive pulmonary disease

## 2022-02-16 NOTE — Assessment & Plan Note (Signed)
The looks great postop day #1

## 2022-02-16 NOTE — Patient Instructions (Signed)

## 2022-02-16 NOTE — Assessment & Plan Note (Addendum)
Postop #1 vitrectomy, 02-15-2022

## 2022-02-23 ENCOUNTER — Encounter (INDEPENDENT_AMBULATORY_CARE_PROVIDER_SITE_OTHER): Payer: BC Managed Care – PPO | Admitting: Ophthalmology

## 2022-02-27 ENCOUNTER — Ambulatory Visit (INDEPENDENT_AMBULATORY_CARE_PROVIDER_SITE_OTHER): Payer: BC Managed Care – PPO | Admitting: Ophthalmology

## 2022-02-27 ENCOUNTER — Encounter (INDEPENDENT_AMBULATORY_CARE_PROVIDER_SITE_OTHER): Payer: Self-pay | Admitting: Ophthalmology

## 2022-02-27 DIAGNOSIS — H43311 Vitreous membranes and strands, right eye: Secondary | ICD-10-CM | POA: Diagnosis not present

## 2022-02-27 DIAGNOSIS — H35371 Puckering of macula, right eye: Secondary | ICD-10-CM

## 2022-02-27 DIAGNOSIS — H43811 Vitreous degeneration, right eye: Secondary | ICD-10-CM

## 2022-02-27 NOTE — Patient Instructions (Signed)
Ofloxacin  4 times daily to the operative eye  Prednisolone acetate 1 drop to the operative eye 4 times daily  Patient instructed not to refill the medications and use them for maximum of 3 weeks.  Patient instructed do not rub the eye.  Patient has the option to use the patch at night. 

## 2022-02-27 NOTE — Assessment & Plan Note (Signed)
Much less topographic distortion right eye

## 2022-02-27 NOTE — Assessment & Plan Note (Signed)
Much improved symptomatically and on examination none seen clinically

## 2022-02-27 NOTE — Assessment & Plan Note (Signed)
Essentially resolved post vitrectomy

## 2022-02-27 NOTE — Progress Notes (Signed)
02/27/2022     CHIEF COMPLAINT Patient presents for  Chief Complaint  Patient presents with   Post-op Follow-up      HISTORY OF PRESENT ILLNESS: Mary Hughes is a 61 y.o. female who presents to the clinic today for:   HPI     Post-op Follow-up           Laterality: right eye   Discomfort: floaters.  Negative for pain, itching, foreign body sensation, tearing and discharge   Vision: is improved         Comments   1 week for POST OP OD OCT 02/15/22, OD much fewer floaters in the visual axis now, overall improved acuity Pt stated vision has improved since last visit.       Last edited by Hurman Horn, MD on 02/27/2022  4:02 PM.      Referring physician: Gaynelle Arabian, MD [address] [place],  [place] [address]  HISTORICAL INFORMATION:   Selected notes from the MEDICAL RECORD NUMBER       CURRENT MEDICATIONS: Current Outpatient Medications (Ophthalmic Drugs)  Medication Sig   ofloxacin (OCUFLOX) 0.3 % ophthalmic solution Place 1 drop into the right eye 4 (four) times daily for 21 days.   prednisoLONE acetate (PRED FORTE) 1 % ophthalmic suspension Place 1 drop into the right eye 4 (four) times daily for 21 days.   No current facility-administered medications for this visit. (Ophthalmic Drugs)   Current Outpatient Medications (Other)  Medication Sig   amLODipine (NORVASC) 5 MG tablet Take 1 tablet (5 mg total) by mouth in the morning and at bedtime.   Chelated Zinc 50 MG TABS    cholecalciferol (VITAMIN D) 1000 UNITS tablet Take 2,000 Units by mouth daily.    Clindamycin-Benzoyl Per, Refr, gel Apply 1 application topically as directed.    levothyroxine (SYNTHROID) 50 MCG tablet Take 1 tablet (50 mcg total) by mouth daily.   meloxicam (MOBIC) 15 MG tablet Take 15 mg by mouth daily.   Multiple Vitamin (MULTI VITAMIN DAILY PO) Take by mouth.   rosuvastatin (CRESTOR) 10 MG tablet Take 1 tablet (10 mg total) by mouth daily.    triamterene-hydrochlorothiazide (MAXZIDE-25) 37.5-25 MG tablet Take 0.5 tablets by mouth daily.   No current facility-administered medications for this visit. (Other)      REVIEW OF SYSTEMS: ROS   Negative for: Constitutional, Gastrointestinal, Neurological, Skin, Genitourinary, Musculoskeletal, HENT, Endocrine, Cardiovascular, Eyes, Respiratory, Psychiatric, Allergic/Imm, Heme/Lymph Last edited by Silvestre Moment on 02/27/2022  3:35 PM.       ALLERGIES Allergies  Allergen Reactions   Lisinopril Swelling    Swelling of lips   Estradiol Other (See Comments)    PAST MEDICAL HISTORY Past Medical History:  Diagnosis Date   Hypertension    Ovarian mass 06/2005   RIGHT OVARIAN ECHOGENIC MASS   S/P endometrial ablation 10/07   Thyroid disease    Hypo thyroid   Past Surgical History:  Procedure Laterality Date   COLONOSCOPY  04/03/2012   Procedure: COLONOSCOPY;  Surgeon: Wonda Horner, MD;  Location: [facility];  Service: Endoscopy;  Laterality: N/A;   ENDOMETRIAL ABLATION  04/18/2006   HIP SURGERY  08/2004   LEFT HIP REPLACEMENT   HYSTEROSCOPY  04/18/2006   HYST, D&C WITH NOVASURE   JOINT REPLACEMENT     left hip   PILONIDAL CYST DRAINAGE     Removed    FAMILY HISTORY Family History  Problem Relation Age of Onset  Hypertension Mother    Lung cancer Mother    Heart disease Father    Cancer Father        Throat,Lymphoma   Diabetes Maternal Grandmother    Heart disease Maternal Grandfather    Breast cancer Neg Hx     SOCIAL HISTORY Social History   Tobacco Use   Smoking status: Never   Smokeless tobacco: Never  Vaping Use   Vaping Use: Never used  Substance Use Topics   Alcohol use: Yes    Alcohol/week: 10.0 standard drinks of alcohol    Types: 10 Standard drinks or equivalent per week   Drug use: No         OPHTHALMIC EXAM:  Base Eye Exam     Visual Acuity (ETDRS)       Right Left   Dist [place] 20/20 -1 20/20         Tonometry (Tonopen, 3:40 PM)        Right Left   Pressure 22 24         Pupils       Pupils APD   Right PERRL None   Left PERRL None         Visual Fields       Left Right    Full Full         Extraocular Movement       Right Left    Full, Ortho Full, Ortho         Neuro/Psych     Oriented x3: Yes   Mood/Affect: Normal         Dilation     Right eye: 2.5% Phenylephrine, 1.0% Mydriacyl @ 3:39 PM           Slit Lamp and Fundus Exam     External Exam       Right Left   External Normal Normal         Slit Lamp Exam       Right Left   Lids/Lashes Normal Normal   Conjunctiva/Sclera Trace Chemosis, 2+ Injection White and quiet   Cornea Clear Clear   Anterior Chamber Deep and quiet Deep and quiet   Iris dilated Round and reactive   Lens Centered posterior chamber intraocular lens Centered posterior chamber intraocular lens   Anterior Vitreous Normal clear, avitric         Fundus Exam       Right Left   Posterior Vitreous Clear, avitric    Disc With coving of the vessels at the superior pole and inferior pole of nerve    C/D Ratio 0.65    Macula Much less topo distortion    Vessels Normal    Periphery Old retinal tear centered at 11:30 position superotemporally with excellent laser retinopexy no extension no subretinal fluid, A. tach 360             IMAGING AND PROCEDURES  Imaging and Procedures for 02/27/22  OCT, Retina - OU - Both Eyes       Right Eye Quality was good. Scan locations included subfoveal. Central Foveal Thickness: 372. Progression has improved. Findings include abnormal foveal contour, epiretinal membrane.   Left Eye Quality was good. Scan locations included subfoveal. Central Foveal Thickness: 268. Progression has been stable. Findings include normal foveal contour.   Notes OD, now improved post removal of internal limiting membrane               ASSESSMENT/PLAN:  Vitreous membranes and strands of  right eye Much improved  symptomatically and on examination none seen clinically  Posterior vitreous detachment, right eye Essentially resolved post vitrectomy  Macular pucker, right eye Much less topographic distortion right eye     ICD-10-CM   1. 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 Posterior vitreous detachment, right eye  H43.811     3. Macular pucker, right eye  H35.371       1.  OD looks great, post vitrectomy membrane peel as well as removal of vitreous membranes and strands visually symptomatic.  2.  Tolerating medications topically OD very well.  3.  May return to full activity today with the exception of compression and mashing of rubbing the eye  Ophthalmic Meds Ordered this visit:  No orders of the defined types were placed in this encounter.      Return in about 3 months (around 05/29/2022) for dilate, OD, OCT.  Patient Instructions  Ofloxacin  4 times daily to the operative eye  Prednisolone acetate 1 drop to the operative eye 4 times daily  Patient instructed not to refill the medications and use them for maximum of 3 weeks.  Patient instructed do not rub the eye.  Patient has the option to use the patch at night.    Explained the diagnoses, plan, and follow up with the patient and they expressed understanding.  Patient expressed understanding of the importance of proper follow up care.   Clent Demark Kimbly Eanes M.D. Diseases & Surgery of the Retina and Vitreous Retina & Diabetic [place] 02/27/22     Abbreviations: M myopia (nearsighted); A astigmatism; H hyperopia (farsighted); P presbyopia; Mrx spectacle prescription;  CTL contact lenses; OD right eye; OS left eye; OU both eyes  XT exotropia; ET esotropia; PEK punctate epithelial keratitis; PEE punctate epithelial erosions; DES dry eye syndrome; MGD meibomian gland dysfunction; ATs artificial tears; PFAT's preservative free artificial tears; [place] nuclear sclerotic cataract; PSC  posterior subcapsular cataract; ERM epi-retinal membrane; PVD posterior vitreous detachment; RD retinal detachment; DM diabetes mellitus; DR diabetic retinopathy; NPDR non-proliferative diabetic retinopathy; PDR proliferative diabetic retinopathy; CSME clinically significant macular edema; DME diabetic macular edema; dbh dot blot hemorrhages; CWS cotton wool spot; POAG primary open angle glaucoma; C/D cup-to-disc ratio; HVF humphrey visual field; GVF goldmann visual field; OCT optical coherence tomography; IOP intraocular pressure; BRVO Branch retinal vein occlusion; CRVO central retinal vein occlusion; CRAO central retinal artery occlusion; BRAO branch retinal artery occlusion; RT retinal tear; SB scleral buckle; PPV pars plana vitrectomy; VH Vitreous hemorrhage; PRP panretinal laser photocoagulation; IVK intravitreal kenalog; VMT vitreomacular traction; MH Macular hole;  NVD neovascularization of the disc; NVE neovascularization elsewhere; AREDS age related eye disease study; ARMD age related macular degeneration; POAG primary open angle glaucoma; EBMD epithelial/anterior basement membrane dystrophy; ACIOL anterior chamber intraocular lens; IOL intraocular lens; PCIOL posterior chamber intraocular lens; Phaco/IOL phacoemulsification with intraocular lens placement; [place] photorefractive keratectomy; LASIK laser assisted in situ keratomileusis; HTN hypertension; DM diabetes mellitus; COPD chronic obstructive pulmonary disease

## 2022-03-09 DIAGNOSIS — Z86010 Personal history of colonic polyps: Secondary | ICD-10-CM | POA: Diagnosis not present

## 2022-03-09 DIAGNOSIS — I10 Essential (primary) hypertension: Secondary | ICD-10-CM | POA: Diagnosis not present

## 2022-03-09 DIAGNOSIS — E063 Autoimmune thyroiditis: Secondary | ICD-10-CM | POA: Diagnosis not present

## 2022-03-09 DIAGNOSIS — Z23 Encounter for immunization: Secondary | ICD-10-CM | POA: Diagnosis not present

## 2022-05-30 ENCOUNTER — Encounter (INDEPENDENT_AMBULATORY_CARE_PROVIDER_SITE_OTHER): Payer: BC Managed Care – PPO | Admitting: Ophthalmology

## 2022-07-10 DIAGNOSIS — L738 Other specified follicular disorders: Secondary | ICD-10-CM | POA: Diagnosis not present

## 2022-07-19 DIAGNOSIS — Z86010 Personal history of colonic polyps: Secondary | ICD-10-CM | POA: Diagnosis not present

## 2022-07-19 DIAGNOSIS — D175 Benign lipomatous neoplasm of intra-abdominal organs: Secondary | ICD-10-CM | POA: Diagnosis not present

## 2022-07-19 DIAGNOSIS — K5730 Diverticulosis of large intestine without perforation or abscess without bleeding: Secondary | ICD-10-CM | POA: Diagnosis not present

## 2022-07-31 DIAGNOSIS — Z961 Presence of intraocular lens: Secondary | ICD-10-CM | POA: Diagnosis not present

## 2022-07-31 DIAGNOSIS — H40013 Open angle with borderline findings, low risk, bilateral: Secondary | ICD-10-CM | POA: Diagnosis not present

## 2022-08-10 ENCOUNTER — Encounter: Payer: Self-pay | Admitting: Internal Medicine

## 2022-08-14 MED ORDER — TRIAMTERENE-HCTZ 37.5-25 MG PO TABS: 1.0000 | ORAL_TABLET | Freq: Every day | ORAL | 3 refills | Status: DC

## 2022-08-14 MED ORDER — AMLODIPINE BESYLATE 5 MG PO TABS: 5.0000 mg | ORAL_TABLET | Freq: Every day | ORAL | 3 refills | Status: DC

## 2022-08-14 NOTE — Addendum Note (Signed)
Addended byBernestine Amass on: 08/14/2022 07:52 AM   Modules accepted: Orders

## 2022-08-14 NOTE — Telephone Encounter (Signed)
I spoke with patient    Recomm she increase Maxzide to 1 tab per day Cut back on amlodipine t o5 mg  daily   Follow BP

## 2022-09-07 DIAGNOSIS — Z713 Dietary counseling and surveillance: Secondary | ICD-10-CM | POA: Diagnosis not present

## 2022-09-14 ENCOUNTER — Encounter: Payer: Self-pay | Admitting: Internal Medicine

## 2022-09-14 ENCOUNTER — Ambulatory Visit: Payer: BC Managed Care – PPO | Admitting: Internal Medicine

## 2022-09-14 VITALS — BP 112/68 | HR 86 | Ht 66.0 in | Wt 146.0 lb

## 2022-09-14 DIAGNOSIS — E063 Autoimmune thyroiditis: Secondary | ICD-10-CM

## 2022-09-14 DIAGNOSIS — K9041 Non-celiac gluten sensitivity: Secondary | ICD-10-CM

## 2022-09-14 DIAGNOSIS — E038 Other specified hypothyroidism: Secondary | ICD-10-CM

## 2022-09-14 LAB — TSH: TSH: 2.97 u[IU]/mL (ref 0.35–5.50)

## 2022-09-14 LAB — T4, FREE: Free T4: 0.89 ng/dL (ref 0.60–1.60)

## 2022-09-14 MED ORDER — LEVOTHYROXINE SODIUM 50 MCG PO TABS
50.0000 ug | ORAL_TABLET | Freq: Every day | ORAL | 3 refills | Status: DC
Start: 2022-09-14 — End: 2023-09-10

## 2022-09-14 NOTE — Patient Instructions (Addendum)
Please continue Levothyroxine 50 mcg daily. ? ?Take the thyroid hormone every day, with water, at least 30 minutes before breakfast, separated by at least 4 hours from: ?- acid reflux medications ?- calcium ?- iron ?- multivitamins ? ?Please stop at the lab. ? ?Please come back for a follow-up appointment in 1 year. ?

## 2022-09-14 NOTE — Progress Notes (Signed)
Patient ID: Mary Hughes, female   DOB: 02-13-1961, 62 y.o.   MRN: [id number]  HPI  HAWWA Hughes is a 62 y.o.-year-old female, initially referred by her ObGyn Dr., Dr Phineas Real, returning for f/u for Hashimoto's hypothyroidism. Last visit 1 year ago.  Interim history: Still has some tongue burning (much improved now). She is eating a gluten-free diet. Colonoscopy 2 mo ago >> clear. She changed Lisinopril to Amlodipine and also increased HCTZ. She mentions coughing as she start of the meal - started >a year ago.  Reviewed history: Pt. has been dx with Hashimoto's thyroiditis in 01/2015.  Retrospectively, she had a thyroid U/S in 2002 for enlarged thyroid >> aspect of the thyroid was heterogeneous >> thyroid inflammation has been present since then.  No thyroid nodules.  We started levothyroxine 10/2018.  She had tongue burning and tingling with generic levothyroxine >> switched to Tirosint 12/2018.  She is now on LT4 50 mcg daily.  She takes LT4 50 mcg daily: - in am - fasting - at least 30 min from b'fast and coffee with creamer, sweetener, half and half, and collagen - no Ca, Fe, PPIs - + MVI at night - not on Biotin  Reviewed her TFTs: Lab Results  Component Value Date   TSH 2.930 01/17/2022   TSH 3.27 09/08/2021   TSH 2.300 08/09/2020   TSH 2.87 09/09/2019   TSH 2.27 12/13/2018   TSH 9.20 (H) 10/30/2018   TSH 6.20 (H) 09/09/2018   TSH 3.98 10/22/2017   TSH 4.70 (H) 09/07/2017   TSH 3.85 09/08/2016   FREET4 0.71 01/17/2022   FREET4 0.85 09/08/2021   FREET4 0.83 09/09/2019   FREET4 0.69 12/13/2018   FREET4 0.80 10/30/2018   FREET4 0.53 (L) 09/09/2018   FREET4 0.53 (L) 10/22/2017   FREET4 0.53 (L) 09/07/2017   FREET4 0.76 09/08/2016   FREET4 0.58 (L) 03/16/2016   T3FREE 2.6 01/17/2022   T3FREE 3.3 09/09/2018   T3FREE 3.2 09/07/2017   T3FREE 3.4 09/08/2016   T3FREE 3.3 03/16/2016   T3FREE 2.9 11/09/2015   T3FREE 3.1 09/09/2015   T3FREE 3.2 03/12/2015    She had very high TPO antibodies: Component     Latest Ref Rng 09/08/2021  Thyroperoxidase Ab SerPl-aCnc     <9 IU/mL >900 (H)    Component     Latest Ref Rng & Units 09/09/2019  Thyroperoxidase Ab SerPl-aCnc     <9 IU/mL >900 (H)   Component     Latest Ref Rng & Units 09/09/2018  Thyroperoxidase Ab SerPl-aCnc     <9 IU/mL >900 (H)   Component     Latest Ref Rng & Units 01/21/2015 09/09/2015 (on selenium) 09/08/2016 09/07/2017  Thyroperoxidase Ab SerPl-aCnc     <9 IU/mL >900 (H) >900 (H) >900 (H) >900 (H)   We tried selenium 200 mg daily 08/2016, but we stopped since her antibodies remain high.  Pt denies: - feeling nodules in neck - hoarseness - dysphagia - choking  She has + FH of thyroid disorders in: sister with Hashimoto's thyroiditis, + thyroid nodule in daughter.  No FH of thyroid cancer. No h/o radiation tx to head or neck. No herbal supplements. No Biotin use. No recent steroids use.   She also has HTN (dx 2016), GERD. She had cataracts removed  (hereditary cataracts). In 2021, she was on minocycline every other day for her skin -for many years.  We stopped the antibiotic in 2021.  Afterwards, she had occasional blemishes, but these  resolved. She was found to have several food allergies and at last visit we checked her for celiac disease.  IgA were elevated, however, the test was otherwise negative:  Component     Latest Ref Rng 09/08/2021  Immunoglobulin A     47 - 310 mg/dL 399 (H)   Gliadin IgA     U/mL <1.0   Deamidated Gliadin Abs, IgG     U/mL <1.0   (tTG) Ab, IgG     U/mL <1.0   (tTG) Ab, IgA     U/mL <1.0   I advised her to discuss with PCP about the possible need for further testing.  ROS: + see HPI  I reviewed pt's medications, allergies, PMH, social hx, family hx, and changes were documented in the history of present illness. Otherwise, unchanged from my initial visit note.  Past Medical History:  Diagnosis Date   Hypertension    Ovarian mass  06/2005   RIGHT OVARIAN ECHOGENIC MASS   S/P endometrial ablation 10/07   Thyroid disease    Hypo thyroid   Past Surgical History:  Procedure Laterality Date   COLONOSCOPY  04/03/2012   Procedure: COLONOSCOPY;  Surgeon: Wonda Horner, MD;  Location: [facility];  Service: Endoscopy;  Laterality: N/A;   ENDOMETRIAL ABLATION  04/18/2006   HIP SURGERY  08/2004   LEFT HIP REPLACEMENT   HYSTEROSCOPY  04/18/2006   HYST, D&C WITH NOVASURE   JOINT REPLACEMENT     left hip   PILONIDAL CYST DRAINAGE     Removed   Social History   Social History   Marital Status: Married    Spouse Name: N/A   Number of Children: 2   Occupational History   COO at [employer]   Social History Main Topics   Smoking status: Never Smoker    Smokeless tobacco: Never Used   Alcohol Use: 6.0 oz/week    Wine, beer - 5x a week, 2-3 Standard drinks or equivalent   Drug Use: No   Sexual Activity: Yes    Birth Control/ Protection: Other-see comments     Comment: Vasectomy-1st intercourse 62 yo-Fewer than 5 partners   Current Outpatient Medications on File Prior to Visit  Medication Sig Dispense Refill   amLODipine (NORVASC) 5 MG tablet Take 1 tablet (5 mg total) by mouth daily. 90 tablet 3   Chelated Zinc 50 MG TABS      cholecalciferol (VITAMIN D) 1000 UNITS tablet Take 2,000 Units by mouth daily.      Clindamycin-Benzoyl Per, Refr, gel Apply 1 application topically as directed.      levothyroxine (SYNTHROID) 50 MCG tablet Take 1 tablet (50 mcg total) by mouth daily. 90 tablet 3   meloxicam (MOBIC) 15 MG tablet Take 15 mg by mouth daily.     Multiple Vitamin (MULTI VITAMIN DAILY PO) Take by mouth.     rosuvastatin (CRESTOR) 10 MG tablet Take 1 tablet (10 mg total) by mouth daily. 90 tablet 3   triamterene-hydrochlorothiazide (MAXZIDE-25) 37.5-25 MG tablet Take 1 tablet by mouth daily. 90 tablet 3   No current facility-administered medications on file prior to visit.   Allergies  Allergen  Reactions   Lisinopril Swelling    Swelling of lips   Estradiol Other (See Comments)   Family History  Problem Relation Age of Onset   Hypertension Mother    Lung cancer Mother    Heart disease Father    Cancer Father  Throat,Lymphoma   Diabetes Maternal Grandmother    Heart disease Maternal Grandfather    Breast cancer Neg Hx    PE: BP 112/68   Pulse 86   Ht 5\' 6"  (1.676 m)   Wt 146 lb (66.2 kg)   SpO2 98%   BMI 23.57 kg/m   Wt Readings from Last 3 Encounters:  09/14/22 146 lb (66.2 kg)  01/17/22 140 lb (63.5 kg)  10/12/21 144 lb 3.2 oz (65.4 kg)   Constitutional: normal weight, in NAD Eyes:  EOMI, no exophthalmos ENT: no neck masses, no cervical lymphadenopathy Cardiovascular: RRR, No MRG Respiratory: CTA B Musculoskeletal: no deformities Skin:no rashes Neurological: no tremor with outstretched hands  ASSESSMENT: 1. Hashimoto thyroiditis - Thyroid ultrasound (07/26/2000):  THE THYROID GLAND IS MILDLY HETEROGENEOUS WITHOUT DEFINITE FOCAL LESIONS OR CYSTS.   THE RIGHT LOBEOF THE THYROID MEASURES 1.1 X 1.5 X 4.5 CM  THE LEFT LOBE OF THE THYROID MEASURES 1.4 X 1.1 X 4.3 CM.    NO OTHER ABNORMALITIES ARE IDENTIFIED.  2.  Meet sensitivity  PLAN: 1. Hashimoto's hypothyroidism -Patient with Hashimoto's hypothyroidism, with very high TPO antibodies, not responding to selenium.  He is off selenium now.  TSH started to increase afterwards and we had to start the low-dose levothyroxine.  She had morning and evening of her tongue with a tablets of levothyroxine so we switched to [place] in 2020.  However, the tongue burning sensation continued.  Reviewing her medication list, she was on minocycline and reviewing the medication side effects, these may have been the cause for her hypothyroidism and also her burning tongue.  She stopped the medication and symptoms improved, however, before last visit, she was found to have several food allergies which may also have  contributed. - she switched to LT4 tablets - latest thyroid labs reviewed with pt. >> normal: Lab Results  Component Value Date   TSH 2.930 01/17/2022  - she continues on LT4 50 mcg daily - pt feels good on this dose. - we discussed about taking the thyroid hormone every day, with water, >30 minutes before breakfast, separated by >4 hours from acid reflux medications, calcium, iron, multivitamins. Pt. is taking it correctly. - will check thyroid tests today: TSH and fT4 - If labs are abnormal, she will need to return for repeat TFTs in 1.5 months - OTW, I will see her back in a year  2.  Wheat sensitivity  -Before last visit, she had a food allergy test performed and she was allergic to several foods including wheat -She had very high TPO antibodies, tongue burning, skin changes so I suggested celiac disease test at last visit.  IgA returned elevated, however, the celiac specific immunoglobulins were normal.  I advised her to discuss with PCP to see if further testing is needed.  She did not have further testing since then. -We discussed that her wheat sensitivity and subsequent gut wall inflammation can impair the absorption of levothyroxine. -At today's visit, she also reports cough whenever she eats.  This could be related to acid reflux or other conditions.  We discussed that she possibly needs an EGD.  Advised her to discuss with PCP.  Component     Latest Ref Rng 09/14/2022  TSH     0.35 - 5.50 uIU/mL 2.97   T4,Free(Direct)     0.60 - 1.60 ng/dL 0.89   Normal TFTs.  Philemon Kingdom, MD PhD [facility] Endocrinology

## 2022-10-12 ENCOUNTER — Encounter: Payer: Self-pay | Admitting: Obstetrics & Gynecology

## 2022-10-12 ENCOUNTER — Other Ambulatory Visit: Payer: Self-pay | Admitting: Obstetrics & Gynecology

## 2022-10-12 ENCOUNTER — Ambulatory Visit (INDEPENDENT_AMBULATORY_CARE_PROVIDER_SITE_OTHER): Payer: BC Managed Care – PPO | Admitting: Obstetrics & Gynecology

## 2022-10-12 VITALS — BP 120/76 | HR 83 | Ht 65.5 in | Wt 148.0 lb

## 2022-10-12 DIAGNOSIS — Z780 Asymptomatic menopausal state: Secondary | ICD-10-CM | POA: Diagnosis not present

## 2022-10-12 DIAGNOSIS — Z01419 Encounter for gynecological examination (general) (routine) without abnormal findings: Secondary | ICD-10-CM

## 2022-10-12 DIAGNOSIS — Z1231 Encounter for screening mammogram for malignant neoplasm of breast: Secondary | ICD-10-CM

## 2022-10-12 NOTE — Progress Notes (Signed)
Mary Hughes Nov 16, 1960 [id number]   History:    62 y.o. G2P2L2 Married   RP:  Established patient presenting for annual gyn exam    HPI: Postmenopausal well on no HRT.  No PMB.  No pelvic pain. No pain with IC.  Pap Neg 09/2021.  No h/o abnormal Pap.  Pap reflex at 62 years.  Breasts normal.  Mammo Neg 08/2021, schedule mammo now.  [place]ono 06/2022.  Urine/BMs normal.  BMI 24.25. Good fitness.  Health labs with Fam MD.    Past medical history,surgical history, family history and social history were all reviewed and documented in the EPIC chart.  Gynecologic History Patient's last menstrual period was 05/06/2014.  Obstetric History OB History  Gravida Para Term Preterm AB Living  0 2  SAB IAB Ectopic Multiple Live Births  0   0   2    # Outcome Date GA Lbr Len/2nd Weight Sex Delivery Anes PTL Lv  2 Term           1 Term              ROS: A ROS was performed and pertinent positives and negatives are included in the history. GENERAL: No fevers or chills. HEENT: No change in vision, no earache, sore throat or sinus congestion. NECK: No pain or stiffness. CARDIOVASCULAR: No chest pain or pressure. No palpitations. PULMONARY: No shortness of breath, cough or wheeze. GASTROINTESTINAL: No abdominal pain, nausea, vomiting or diarrhea, melena or bright red blood per rectum. GENITOURINARY: No urinary frequency, urgency, hesitancy or dysuria. MUSCULOSKELETAL: No joint or muscle pain, no back pain, no recent trauma. DERMATOLOGIC: No rash, no itching, no lesions. ENDOCRINE: No polyuria, polydipsia, no heat or cold intolerance. No recent change in weight. HEMATOLOGICAL: No anemia or easy bruising or bleeding. NEUROLOGIC: No headache, seizures, numbness, tingling or weakness. PSYCHIATRIC: No depression, no loss of interest in normal activity or change in sleep pattern.     Exam:   BP 120/76   Pulse 83   Ht 5' 5.5" (1.664 m)   Wt 148 lb (67.1 kg)   LMP 05/06/2014 Comment: sexually  active, husband vasectomy  SpO2 97%   BMI 24.25 kg/m   Body mass index is 24.25 kg/m.  General appearance : Well developed well nourished female. No acute distress HEENT: Eyes: no retinal hemorrhage or exudates,  Neck supple, trachea midline, no carotid bruits, no thyroidmegaly Lungs: Clear to auscultation, no rhonchi or wheezes, or rib retractions  Heart: Regular rate and rhythm, no murmurs or gallops Breast:Examined in sitting and supine position were symmetrical in appearance, no palpable masses or tenderness,  no skin retraction, no nipple inversion, no nipple discharge, no skin discoloration, no axillary or supraclavicular lymphadenopathy Abdomen: no palpable masses or tenderness, no rebound or guarding Extremities: no edema or skin discoloration or tenderness  Pelvic: Vulva: Normal             Vagina: No gross lesions or discharge  Cervix: No gross lesions or discharge  Uterus  AV, normal size, shape and consistency, non-tender and mobile  Adnexa  Without masses or tenderness  Anus: Normal   Assessment/Plan:  62 y.o. female for annual exam   1. Well female exam with routine gynecological exam Postmenopausal well on no HRT.  No PMB.  No pelvic pain. No pain with IC.  Pap Neg 09/2021.  No h/o abnormal Pap.  Pap reflex at 62 years.  Breasts normal.  Mammo Neg 08/2021,  schedule mammo now.  Colono 06/2022.  Urine/BMs normal.  BMI 24.25. Good fitness.  Health labs with Fam MD.    2. Postmenopause Postmenopausal well on no HRT.  No PMB.  No pelvic pain. No pain with IC.    Other orders - Misc Natural Products (OSTEO BI-FLEX ADV TRIPLE ST PO) - Cholecalciferol (VITAMIN D3 PO); Take 2,000 Int'l Units by mouth.   Genia Del MD, 9:14 AM

## 2022-10-23 NOTE — Progress Notes (Signed)
Cardiology Office Note   Date:  10/27/2022   ID:  Mary Hughes, DOB [birth year], MRN [id number]  PCP:  Blair Heys, MD  Cardiologist:   Dietrich Pates, MD   Pt presents for continued cardiac care of HTN, CAD      History of Present Illness: Mary Hughes is a 62 y.o. female with a history of HTN, MR, CAD (CT calcium score of 6.1 (71st percentile in 2023) GERD and and Hashimoto's hyothyoroidsim   Echo was done in Feb 2021 showed mild MR      I last saw the pt in clinic in April 2023   SInce seen she has done OK from a cardiac standpoint  Breathing is OK  She denies CP    Notes her heart going faster at times in bed Brief.  She also says she feels thirsty all the time   ADmits she probably has to drink more water    Current Meds  Medication Sig   amLODipine (NORVASC) 5 MG tablet Take 1 tablet (5 mg total) by mouth daily.   Bioflavonoid Products (BIOFLEX PO) Take by mouth. 2 tabs daily   Chelated Zinc 50 MG TABS    Cholecalciferol (VITAMIN D3 PO) Take 2,000 Int'l Units by mouth.   Clindamycin-Benzoyl Per, Refr, gel Apply 1 application topically as directed.    levothyroxine (SYNTHROID) 50 MCG tablet Take 1 tablet (50 mcg total) by mouth daily.   meloxicam (MOBIC) 15 MG tablet Take 15 mg by mouth as needed.   Misc Natural Products (OSTEO BI-FLEX ADV TRIPLE ST PO)    Multiple Vitamin (MULTI VITAMIN DAILY PO) Take by mouth.   rosuvastatin (CRESTOR) 10 MG tablet Take 1 tablet (10 mg total) by mouth daily.   triamterene-hydrochlorothiazide (MAXZIDE-25) 37.5-25 MG tablet Take 1 tablet by mouth daily.     Allergies:   Lisinopril   Past Medical History:  Diagnosis Date   Elevated cholesterol    Hypertension    Ovarian mass 06/2005   RIGHT OVARIAN ECHOGENIC MASS   S/P endometrial ablation 03/2006   Thyroid disease    Hypo thyroid    Past Surgical History:  Procedure Laterality Date   COLONOSCOPY  04/03/2012   Procedure: COLONOSCOPY;  Surgeon: Graylin Shiver, MD;   Location: [facility];  Service: Endoscopy;  Laterality: N/A;   ENDOMETRIAL ABLATION  04/18/2006   HIP SURGERY  08/2004   LEFT HIP REPLACEMENT   HYSTEROSCOPY  04/18/2006   HYST, D&C WITH NOVASURE   JOINT REPLACEMENT     left hip   PILONIDAL CYST DRAINAGE     Removed     Social History:  The patient  reports that she has never smoked. She has never used smokeless tobacco. She reports current alcohol use of about 10.0 standard drinks of alcohol per week. She reports that she does not use drugs.   Family History:  The patient's family history includes Cancer in her father; Diabetes in her maternal grandmother; Heart disease in her father and maternal grandfather; Hypertension in her mother; Lung cancer in her mother.    ROS:  Please see the history of present illness. All other systems are reviewed and  Negative to the above problem except as noted.    PHYSICAL EXAM: VS:  BP 110/68   Pulse 86   Ht 5' 5.5" (1.664 m)   Wt 147 lb 9.6 oz (67 kg)   LMP 05/06/2014 Comment: sexually active, husband vasectomy  SpO2 98%   BMI 24.19 kg/m  GEN: Well nourished, well developed, in no acute distress  HEENT: normal  Neck: no JVD, carotid bruit Cardiac: RRR; no murmurs  No LE edema  Respiratory:  clear to auscultation GI: soft, nontender,  No hepatomegaly  MS: no deformity    EKG:  EKG is ordered today.  SR 86 bpm   Echo   07/22/19  1. Left ventricular ejection fraction, by visual estimation, is 60 to 65%. The left ventricle has normal function. There is no left ventricular hypertrophy. 2. Global right ventricle has normal systolic function.The right ventricular size is normal. No increase in right ventricular wall thickness. 3. Left atrial size was normal. 4. Right atrial size was normal. 5. The mitral valve is abnormal. Mild mitral valve regurgitation. No evidence of mitral stenosis. Late systolic bowing without frank prolapse. 6. The tricuspid valve is normal in structure. 7. The  tricuspid valve is normal in structure. Tricuspid valve regurgitation is trivial. 8. The aortic valve is normal in structure. Aortic valve regurgitation is not visualized. No evidence of aortic valve sclerosis or stenosis. 9. The pulmonic valve was normal in structure. Pulmonic valve regurgitation is trivial. 10. Normal pulmonary artery systolic pressure. 11. The inferior vena cava is normal in size with greater than 50% respiratory variability, suggesting right atrial pressure of 3 mmHg. 12. Left ventricular diastolic parameters are consistent with Grade I diastolic dysfunction (impaired relaxation). 13. The left ventricle has no regional wall motion abnormalities.  Lipid Panel    Component Value Date/Time   CHOL 201 (H) 08/09/2020 0845   TRIG 57 08/09/2020 0845   HDL 85 08/09/2020 0845   CHOLHDL 2.4 08/09/2020 0845   CHOLHDL 2.4 01/18/2015 0930   VLDL 12 01/18/2015 0930   LDLCALC 105 (H) 08/09/2020 0845      Wt Readings from Last 3 Encounters:  10/26/22 147 lb 9.6 oz (67 kg)  10/12/22 148 lb (67.1 kg)  09/14/22 146 lb (66.2 kg)      ASSESSMENT AND PLAN:  1.  HTN  BP is excellent  Given thirst, I would recommend a  trial of cutting back on Maxzide to 1/2 tab per day   Follow BP and thirst      Check labs today   2    CAD  Found on Calcium score CT   Score was 6.1.   (Aug 2023) Pt without concerning symptoms    Rx BP and lipids   Watch diet   3 HL    WIll check lipomed and liver panel today    4 Diet   Now gluten free  REviewed Mediterranean diet.   Minimally processed foods     5  Hashimoto's    Followed in endocrine    6  Allergies   Angioedema with lisinopril     Plan for f/u in 1 yrear  Current medicines are reviewed at length with the patient today.  The patient does not have concerns regarding medicines.  Signed, Dietrich Pates, MD  10/27/2022 8:21 AM    [facility] [address] [place], [place], [place]  [address] Phone: [phone]; Fax:  [phone]

## 2022-10-25 ENCOUNTER — Ambulatory Visit
Admission: RE | Admit: 2022-10-25 | Discharge: 2022-10-25 | Disposition: A | Discharge status: Home / Self Care | Payer: BC Managed Care – PPO | Source: Ambulatory Visit | Attending: Obstetrics & Gynecology | Admitting: Obstetrics & Gynecology

## 2022-10-25 DIAGNOSIS — Z1231 Encounter for screening mammogram for malignant neoplasm of breast: Secondary | ICD-10-CM | POA: Diagnosis not present

## 2022-10-26 ENCOUNTER — Encounter: Payer: Self-pay | Admitting: Internal Medicine

## 2022-10-26 ENCOUNTER — Ambulatory Visit: Payer: BC Managed Care – PPO | Attending: Internal Medicine | Admitting: Internal Medicine

## 2022-10-26 VITALS — BP 110/68 | HR 86 | Ht 65.5 in | Wt 147.6 lb

## 2022-10-26 DIAGNOSIS — Z79899 Other long term (current) drug therapy: Secondary | ICD-10-CM

## 2022-10-26 DIAGNOSIS — I10 Essential (primary) hypertension: Secondary | ICD-10-CM | POA: Diagnosis not present

## 2022-10-26 LAB — NMR, LIPOPROFILE

## 2022-10-26 LAB — BASIC METABOLIC PANEL
BUN/Creatinine Ratio: 18 (ref 9–23)
BUN: 13 mg/dL (ref 6–24)
Creatinine, Ser: 0.72 mg/dL (ref 0.57–1.00)
Potassium: 3.6 mmol/L (ref 3.5–5.2)
Sodium: 139 mmol/L (ref 134–144)

## 2022-10-26 LAB — HEPATIC FUNCTION PANEL: Bilirubin, Direct: 0.21 mg/dL (ref 0.00–0.40)

## 2022-10-26 LAB — HEMOGLOBIN A1C

## 2022-10-26 NOTE — Patient Instructions (Signed)
Medication Instructions:  Try cutting the maxzide in 1/2 and monitor your BP  *If you need a refill on your cardiac medications before your next appointment, please call your pharmacy*   Lab Work: NMR, HGBA1C, BMET, HEPATIC   If you have labs (blood work) drawn today and your tests are completely normal, you will receive your results only by: MyChart Message (if you have MyChart) OR A paper copy in the mail If you have any lab test that is abnormal or we need to change your treatment, we will call you to review the results.   Testing/Procedures:    Follow-Up: At [facility], you and your health needs are our priority.  As part of our continuing mission to provide you with exceptional heart care, we have created designated Provider Care Teams.  These Care Teams include your primary Cardiologist (physician) and Advanced Practice Providers (APPs -  Physician Assistants and Nurse Practitioners) who all work together to provide you with the care you need, when you need it.  We recommend signing up for the patient portal called "MyChart".  Sign up information is provided on this After Visit Summary.  MyChart is used to connect with patients for Virtual Visits (Telemedicine).  Patients are able to view lab/test results, encounter notes, upcoming appointments, etc.  Non-urgent messages can be sent to your provider as well.   To learn more about what you can do with MyChart, go to [email or web address].    Your next appointment:   1 year(s)  Provider:   Dietrich Pates, MD     Other Instructions

## 2022-10-27 LAB — HEPATIC FUNCTION PANEL
ALT: 25 IU/L (ref 0–32)
AST: 37 IU/L (ref 0–40)
Albumin: 4.5 g/dL (ref 3.8–4.9)
Alkaline Phosphatase: 91 IU/L (ref 44–121)
Bilirubin Total: 0.8 mg/dL (ref 0.0–1.2)
Total Protein: 7.5 g/dL (ref 6.0–8.5)

## 2022-10-27 LAB — NMR, LIPOPROFILE
Cholesterol, Total: 182 mg/dL (ref 100–199)
HDL Particle Number: 46.2 umol/L (ref >=30.5)
LDL Particle Number: 666 nmol/L (ref <1000)
LDL-C (NIH Calc): 82 mg/dL (ref 0–99)

## 2022-10-27 LAB — BASIC METABOLIC PANEL
CO2: 26 mmol/L (ref 20–29)
Calcium: 10.1 mg/dL (ref 8.7–10.2)
Chloride: 100 mmol/L (ref 96–106)
Glucose: 86 mg/dL (ref 70–99)
eGFR: 96 mL/min/{1.73_m2} (ref >59)

## 2022-10-27 LAB — HEMOGLOBIN A1C: Hgb A1c MFr Bld: 5.7 % — ABNORMAL HIGH (ref 4.8–5.6)

## 2022-11-01 DIAGNOSIS — D225 Melanocytic nevi of trunk: Secondary | ICD-10-CM | POA: Diagnosis not present

## 2022-11-01 DIAGNOSIS — D2272 Melanocytic nevi of left lower limb, including hip: Secondary | ICD-10-CM | POA: Diagnosis not present

## 2022-11-01 DIAGNOSIS — L570 Actinic keratosis: Secondary | ICD-10-CM | POA: Diagnosis not present

## 2022-11-01 DIAGNOSIS — L821 Other seborrheic keratosis: Secondary | ICD-10-CM | POA: Diagnosis not present

## 2022-11-01 DIAGNOSIS — L918 Other hypertrophic disorders of the skin: Secondary | ICD-10-CM | POA: Diagnosis not present

## 2022-11-08 ENCOUNTER — Encounter (INDEPENDENT_AMBULATORY_CARE_PROVIDER_SITE_OTHER): Payer: BC Managed Care – PPO | Admitting: Ophthalmology

## 2022-12-12 DIAGNOSIS — H33331 Multiple defects of retina without detachment, right eye: Secondary | ICD-10-CM | POA: Diagnosis not present

## 2022-12-12 DIAGNOSIS — H43811 Vitreous degeneration, right eye: Secondary | ICD-10-CM | POA: Diagnosis not present

## 2022-12-12 DIAGNOSIS — H33311 Horseshoe tear of retina without detachment, right eye: Secondary | ICD-10-CM | POA: Diagnosis not present

## 2022-12-12 DIAGNOSIS — H43311 Vitreous membranes and strands, right eye: Secondary | ICD-10-CM | POA: Diagnosis not present

## 2022-12-21 IMAGING — MG MM DIGITAL SCREENING BILAT W/ TOMO AND CAD
8 series · 9 of 24 positions shown · non-contrast
Comparison: Previous exam(s).

CLINICAL DATA: Screening.

EXAM:
DIGITAL SCREENING BILATERAL MAMMOGRAM WITH TOMOSYNTHESIS AND CAD
TECHNIQUE: Bilateral screening digital craniocaudal and mediolateral oblique
mammograms were obtained. Bilateral screening digital breast
tomosynthesis was performed. The images were evaluated with
computer-aided detection.

[R MLO synth-2D]
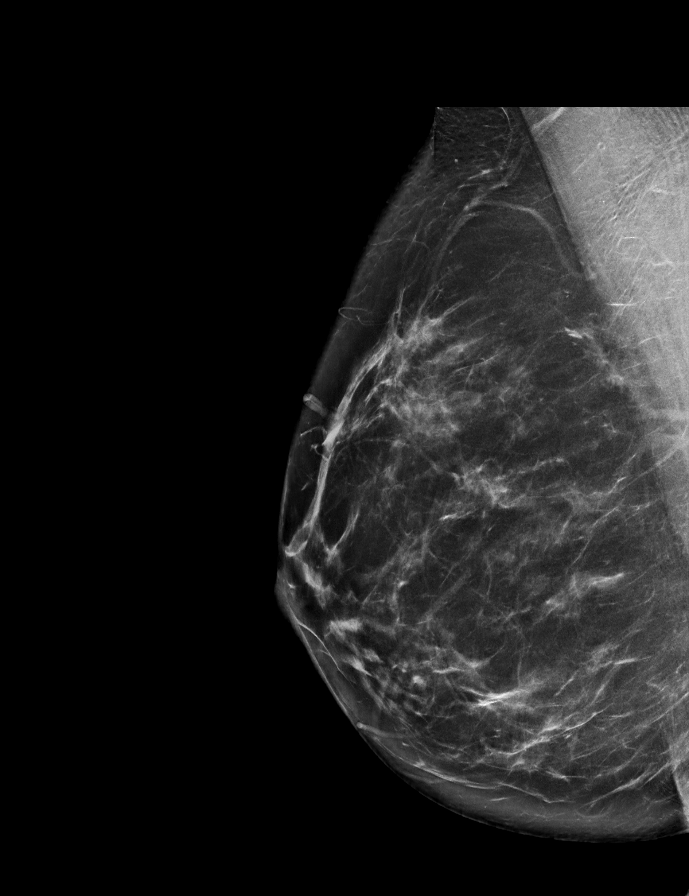

[L CC synth-2D]
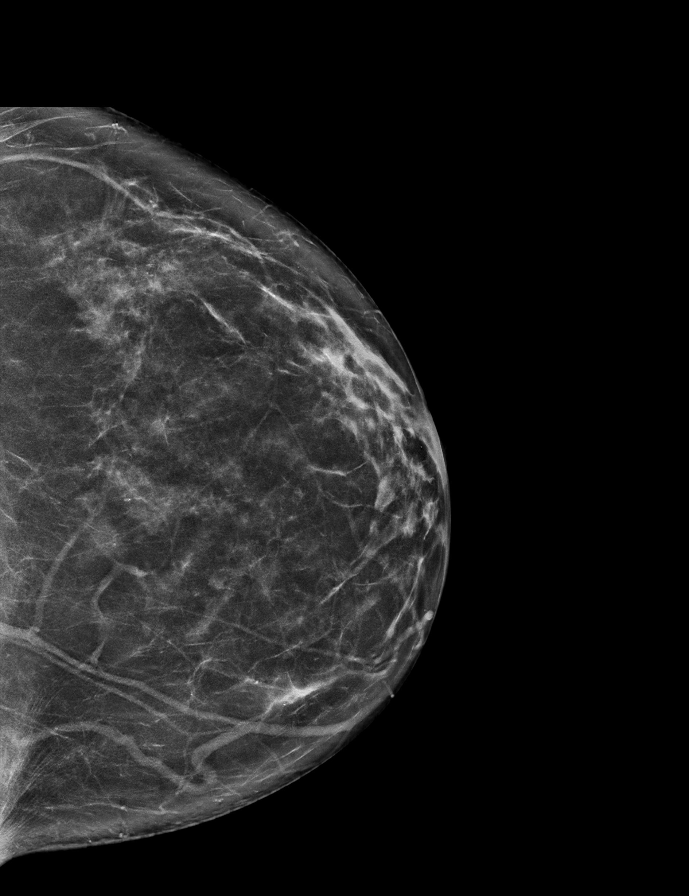

[L MLO synth-2D]
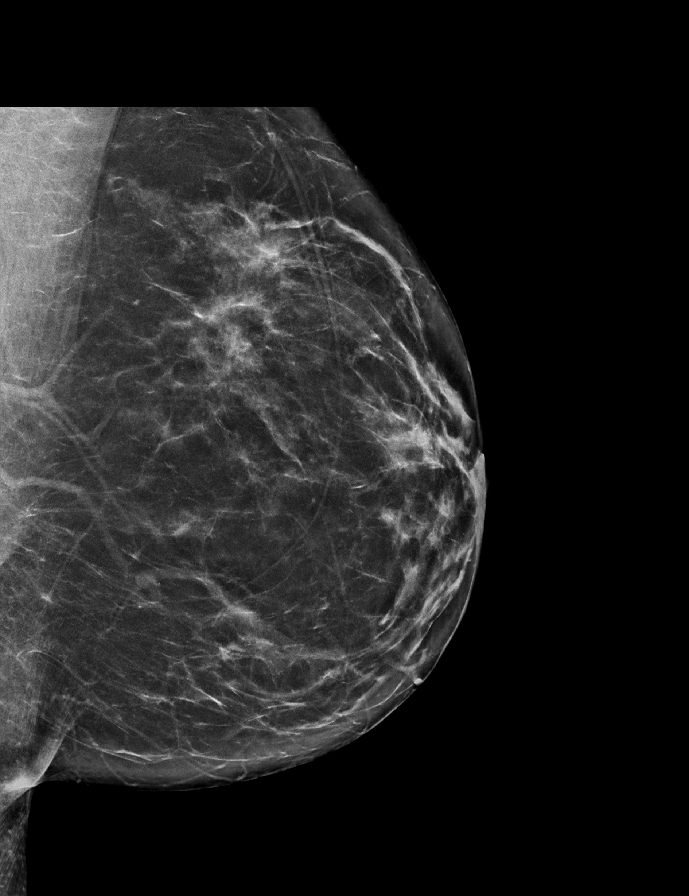

[R CC synth-2D]
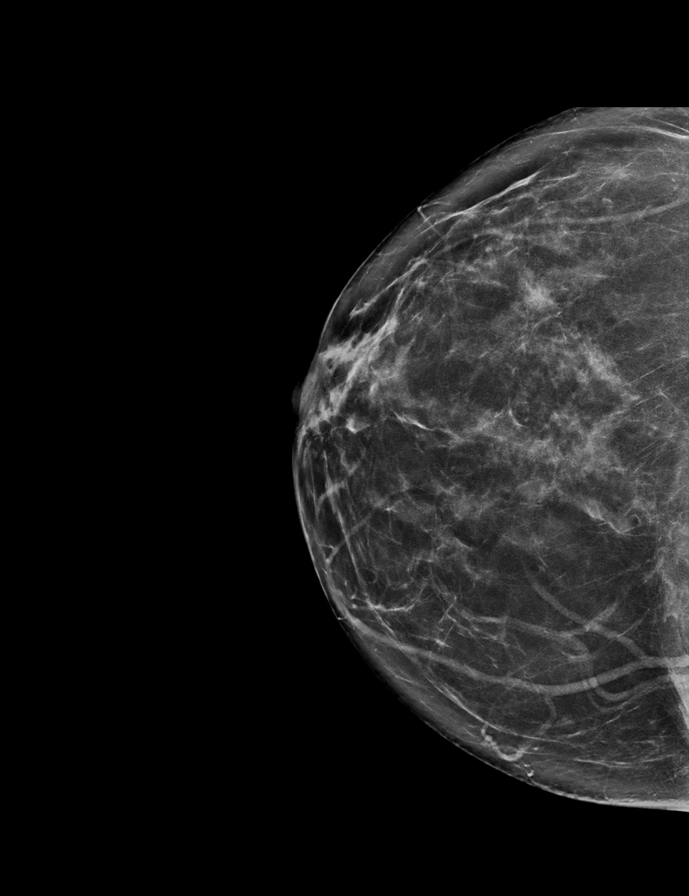

[R MLO tomo · 2 of 79 frames shown]
[frame 26/79]
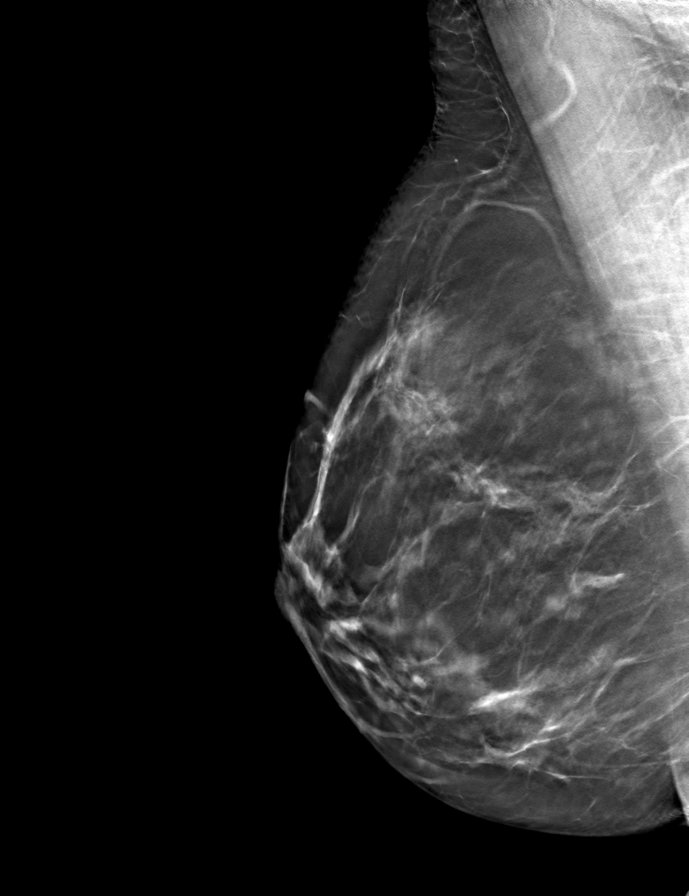
[frame 40/79]
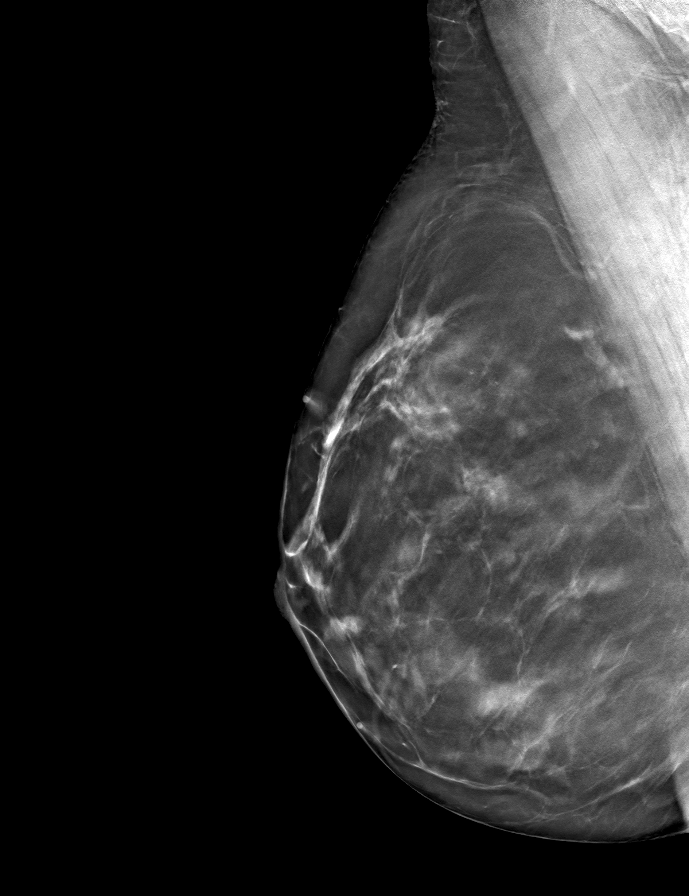

[L MLO tomo · tomo slice 37/73.0]
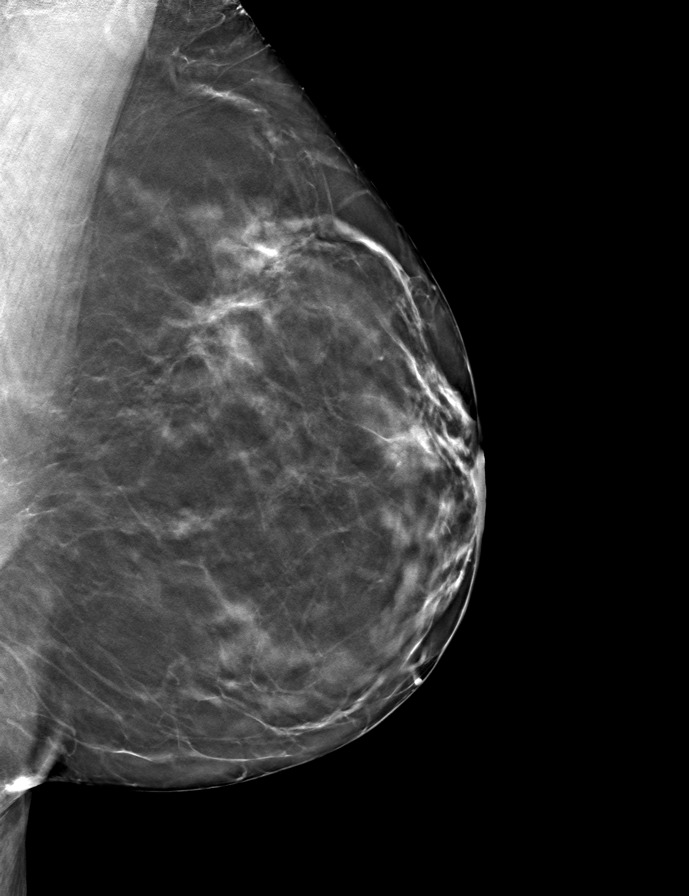

[R CC tomo · tomo slice 35/69.0]
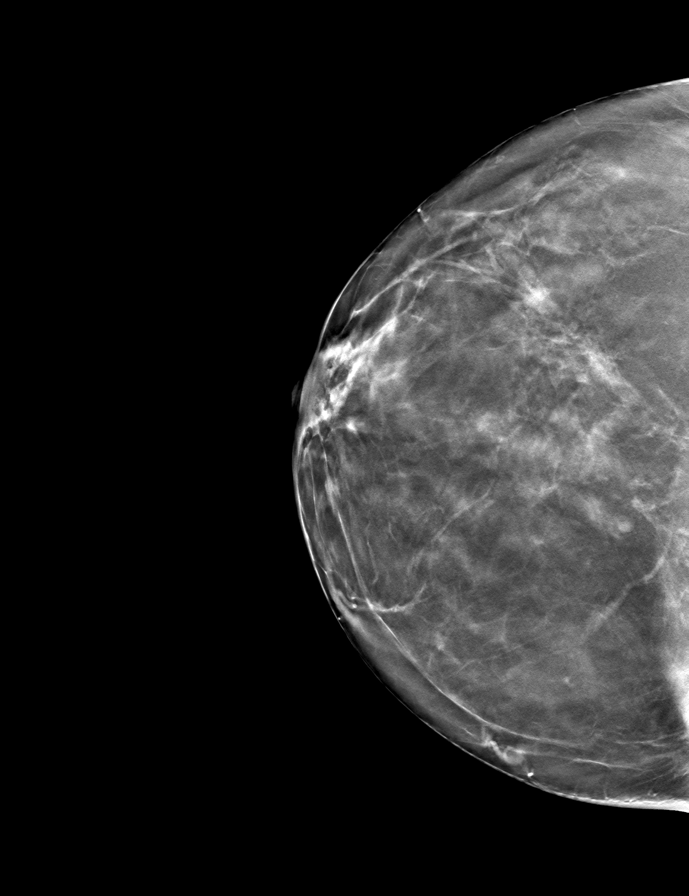

[L CC tomo · tomo slice 35/69.0]
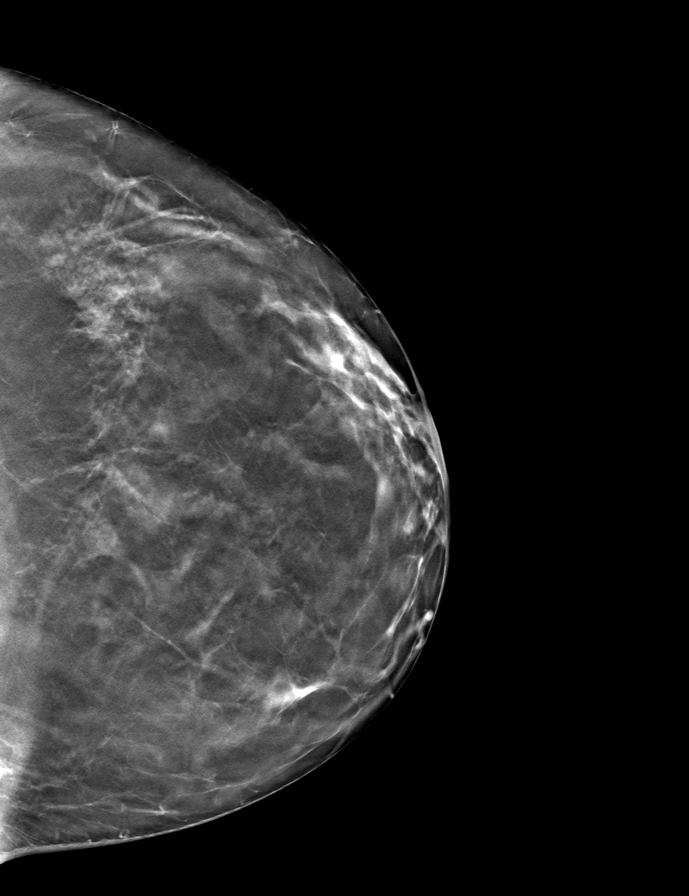

[9 of 24 positions shown; findings below may reference images not displayed]

ACR Breast Density Category b: There are scattered areas of
fibroglandular density.
FINDINGS: There are no findings suspicious for malignancy. The images were
evaluated with computer-aided detection.
IMPRESSION: No mammographic evidence of malignancy. A result letter of this
screening mammogram will be mailed directly to the patient.

RECOMMENDATION:
Screening mammogram in one year. (Code:WJ-I-BG6)

BI-RADS CATEGORY  1: Negative.

## 2023-01-25 DIAGNOSIS — M1711 Unilateral primary osteoarthritis, right knee: Secondary | ICD-10-CM | POA: Diagnosis not present

## 2023-02-16 ENCOUNTER — Encounter: Payer: Self-pay | Admitting: Internal Medicine

## 2023-02-20 DIAGNOSIS — H40013 Open angle with borderline findings, low risk, bilateral: Secondary | ICD-10-CM | POA: Diagnosis not present

## 2023-02-24 ENCOUNTER — Other Ambulatory Visit: Payer: Self-pay | Admitting: Internal Medicine

## 2023-02-25 ENCOUNTER — Other Ambulatory Visit: Payer: Self-pay | Admitting: Internal Medicine

## 2023-03-09 DIAGNOSIS — I10 Essential (primary) hypertension: Secondary | ICD-10-CM | POA: Diagnosis not present

## 2023-03-09 DIAGNOSIS — M179 Osteoarthritis of knee, unspecified: Secondary | ICD-10-CM | POA: Diagnosis not present

## 2023-03-09 DIAGNOSIS — G479 Sleep disorder, unspecified: Secondary | ICD-10-CM | POA: Diagnosis not present

## 2023-03-09 DIAGNOSIS — E063 Autoimmune thyroiditis: Secondary | ICD-10-CM | POA: Diagnosis not present

## 2023-08-13 DIAGNOSIS — J988 Other specified respiratory disorders: Secondary | ICD-10-CM | POA: Diagnosis not present

## 2023-08-13 DIAGNOSIS — R000 Tachycardia, unspecified: Secondary | ICD-10-CM | POA: Diagnosis not present

## 2023-08-13 DIAGNOSIS — I10 Essential (primary) hypertension: Secondary | ICD-10-CM | POA: Diagnosis not present

## 2023-08-13 DIAGNOSIS — R053 Chronic cough: Secondary | ICD-10-CM | POA: Diagnosis not present

## 2023-08-20 ENCOUNTER — Other Ambulatory Visit: Payer: Self-pay | Admitting: Internal Medicine

## 2023-09-10 ENCOUNTER — Encounter: Payer: Self-pay | Admitting: Internal Medicine

## 2023-09-10 ENCOUNTER — Ambulatory Visit: Payer: BC Managed Care – PPO | Admitting: Internal Medicine

## 2023-09-10 VITALS — BP 120/70 | HR 89 | Ht 65.5 in | Wt 151.4 lb

## 2023-09-10 DIAGNOSIS — T17308A Unspecified foreign body in larynx causing other injury, initial encounter: Secondary | ICD-10-CM

## 2023-09-10 DIAGNOSIS — K219 Gastro-esophageal reflux disease without esophagitis: Secondary | ICD-10-CM | POA: Diagnosis not present

## 2023-09-10 DIAGNOSIS — E063 Autoimmune thyroiditis: Secondary | ICD-10-CM | POA: Diagnosis not present

## 2023-09-10 LAB — T4, FREE: Free T4: 1.6 ng/dL (ref 0.8–1.8)

## 2023-09-10 LAB — TSH: TSH: 3.75 m[IU]/L (ref 0.40–4.50)

## 2023-09-10 MED ORDER — LEVOTHYROXINE SODIUM 75 MCG PO TABS: 75.0000 ug | ORAL_TABLET | Freq: Every day | ORAL | 5 refills | Status: DC

## 2023-09-10 NOTE — Progress Notes (Addendum)
 Patient ID: Mary Hughes, female   DOB: April 27, 1961, 63 y.o.   MRN: [id number]  HPI  Mary Hughes is a 63 y.o.-year-old female, initially referred by her ObGyn Dr., Dr Audie Box, returning for f/u for Hashimoto's hypothyroidism. Last visit 1 year ago.  Interim history: She is eating a gluten-free diet but she is frustrated about continuing weight gain.  She gained 5 pounds since last visit.  She continues to exercise consistently. She has choking after eating and continues to have acid reflux even after eating foods like ice cream and yogurt. She had flu A 08/13/2023.  This resolved.  Reviewed and addended history: Pt. has been dx with Hashimoto's thyroiditis in 01/2015.  Retrospectively, she had a thyroid U/S in 2002 for enlarged thyroid >> aspect of the thyroid was heterogeneous >> thyroid inflammation has been present since then.  No thyroid nodules.  We started levothyroxine 10/2018.  She had tongue burning and tingling with generic levothyroxine >> switched to Tirosint 12/2018.  Tongue burning improved after stopping minocycline  She is now on LT4 tablets 50 mcg daily.  She takes LT4 50 mcg daily: - in am - fasting - at least 30 min from b'fast and coffee with creamer, sweetener, half and half, and collagen - no Ca, Fe - + acid reflux medication OTC - prn later in the day - + MVI at night - not on Biotin  Reviewed her TFTs: Lab Results  Component Value Date   TSH 2.97 09/14/2022   TSH 2.930 01/17/2022   TSH 3.27 09/08/2021   TSH 2.300 08/09/2020   TSH 2.87 09/09/2019   TSH 2.27 12/13/2018   TSH 9.20 (H) 10/30/2018   TSH 6.20 (H) 09/09/2018   TSH 3.98 10/22/2017   TSH 4.70 (H) 09/07/2017   FREET4 0.89 09/14/2022   FREET4 0.71 01/17/2022   FREET4 0.85 09/08/2021   FREET4 0.83 09/09/2019   FREET4 0.69 12/13/2018   FREET4 0.80 10/30/2018   FREET4 0.53 (L) 09/09/2018   FREET4 0.53 (L) 10/22/2017   FREET4 0.53 (L) 09/07/2017   FREET4 0.76 09/08/2016   T3FREE 2.6  01/17/2022   T3FREE 3.3 09/09/2018   T3FREE 3.2 09/07/2017   T3FREE 3.4 09/08/2016   T3FREE 3.3 03/16/2016   T3FREE 2.9 11/09/2015   T3FREE 3.1 09/09/2015   T3FREE 3.2 03/12/2015   She had very high TPO antibodies: Component     Latest Ref Rng 09/08/2021  Thyroperoxidase Ab SerPl-aCnc     <9 IU/mL >900 (H)    Component     Latest Ref Rng & Units 09/09/2019  Thyroperoxidase Ab SerPl-aCnc     <9 IU/mL >900 (H)   Component     Latest Ref Rng & Units 09/09/2018  Thyroperoxidase Ab SerPl-aCnc     <9 IU/mL >900 (H)   Component     Latest Ref Rng & Units 01/21/2015 09/09/2015 (on selenium) 09/08/2016 09/07/2017  Thyroperoxidase Ab SerPl-aCnc     <9 IU/mL >900 (H) >900 (H) >900 (H) >900 (H)   We tried selenium 200 mg daily 08/2016, but we stopped since her antibodies remained high.  Pt denies: - feeling nodules in neck - hoarseness  But she has: - Acid reflux - choking  She has + FH of thyroid disorders in: sister with Hashimoto's thyroiditis, + thyroid nodule in daughter.  No FH of thyroid cancer. No h/o radiation tx to head or neck. No herbal supplements. No Biotin use. No recent steroids use.   She also has HTN (dx 2016),  GERD. She had cataracts removed  (hereditary cataracts). In 2021, she was on minocycline every other day for her skin -for many years.  We stopped the antibiotic in 2021.  Afterwards, she had occasional blemishes, but these resolved. She was found to have several food allergies and at last visit we checked her for celiac disease.  IgA were elevated, however, the test was otherwise negative:  Component     Latest Ref Rng 09/08/2021  Immunoglobulin A     47 - 310 mg/dL 161 (H)   Gliadin IgA     U/mL <1.0   Deamidated Gliadin Abs, IgG     U/mL <1.0   (tTG) Ab, IgG     U/mL <1.0   (tTG) Ab, IgA     U/mL <1.0   I advised her to discuss with PCP about the possible need for further testing.  ROS: + see HPI  I reviewed pt's medications, allergies, PMH,  social hx, family hx, and changes were documented in the history of present illness. Otherwise, unchanged from my initial visit note.  Past Medical History:  Diagnosis Date   Elevated cholesterol    Hypertension    Ovarian mass 06/2005   RIGHT OVARIAN ECHOGENIC MASS   S/P endometrial ablation 03/2006   Thyroid disease    Hypo thyroid   Past Surgical History:  Procedure Laterality Date   COLONOSCOPY  04/03/2012   Procedure: COLONOSCOPY;  Surgeon: Graylin Shiver, MD;  Location: [facility];  Service: Endoscopy;  Laterality: N/A;   ENDOMETRIAL ABLATION  04/18/2006   HIP SURGERY  08/2004   LEFT HIP REPLACEMENT   HYSTEROSCOPY  04/18/2006   HYST, D&C WITH NOVASURE   JOINT REPLACEMENT     left hip   PILONIDAL CYST DRAINAGE     Removed   Social History   Social History   Marital Status: Married    Spouse Name: N/A   Number of Children: 2   Occupational History   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[employer], [employer]   Social History Main Topics   Smoking status: Never Smoker    Smokeless tobacco: Never Used   Alcohol Use: 6.0 oz/week    Wine, beer - 5x a week, 2-3 Standard drinks or equivalent   Drug Use: No   Sexual Activity: Yes    Birth Control/ Protection: Other-see comments     Comment: Vasectomy-1st intercourse 63 yo-Fewer than 5 partners   Current Outpatient Medications on File Prior to Visit  Medication Sig Dispense Refill   amLODipine (NORVASC) 5 MG tablet TAKE 1 TABLET (5 MG TOTAL) BY MOUTH IN THE MORNING AND AT BEDTIME 180 tablet 3   Bioflavonoid Products (BIOFLEX PO) Take by mouth. 2 tabs daily     Chelated Zinc 50 MG TABS      Cholecalciferol (VITAMIN D3 PO) Take 2,000 Int'l Units by mouth.     Clindamycin-Benzoyl Per, Refr, gel Apply 1 application topically as directed.      levothyroxine (SYNTHROID) 50 MCG tablet Take 1 tablet (50 mcg total) by mouth daily. 90 tablet 3   meloxicam (MOBIC) 15 MG tablet Take 15 mg by mouth as needed.     Misc Natural Products (OSTEO BI-FLEX ADV TRIPLE  ST PO)      Multiple Vitamin (MULTI VITAMIN DAILY PO) Take by mouth.     rosuvastatin (CRESTOR) 10 MG tablet TAKE 1 TABLET BY MOUTH EVERY DAY 90 tablet 2   triamterene-hydrochlorothiazide (MAXZIDE-25) 37.5-25 MG tablet TAKE 1 TABLET BY MOUTH EVERY DAY 90  tablet 0   No current facility-administered medications on file prior to visit.   Allergies  Allergen Reactions   Lisinopril Swelling    Swelling of lips   Family History  Problem Relation Age of Onset   Hypertension Mother    Lung cancer Mother    Heart disease Father    Cancer Father        Throat,Lymphoma   Diabetes Maternal Grandmother    Heart disease Maternal Grandfather    PE: BP 120/70   Pulse 89   Ht 5' 5.5" (1.664 m)   Wt 151 lb 6.4 oz (68.7 kg)   LMP 05/06/2014 Comment: sexually active, husband vasectomy  SpO2 98%   BMI 24.81 kg/m   Wt Readings from Last 3 Encounters:  09/10/23 151 lb 6.4 oz (68.7 kg)  10/26/22 147 lb 9.6 oz (67 kg)  10/12/22 148 lb (67.1 kg)   Constitutional: normal weight, in NAD Eyes:  EOMI, no exophthalmos ENT: no neck masses, no cervical lymphadenopathy Cardiovascular: RRR, No MRG Respiratory: CTA B Musculoskeletal: no deformities Skin:no rashes Neurological: no tremor with outstretched hands  ASSESSMENT: 1. Hashimoto thyroiditis - Thyroid ultrasound (07/26/2000):  THE THYROID GLAND IS MILDLY HETEROGENEOUS WITHOUT DEFINITE FOCAL LESIONS OR CYSTS.   THE RIGHT LOBEOF THE THYROID MEASURES 1.1 X 1.5 X 4.5 CM  THE LEFT LOBE OF THE THYROID MEASURES 1.4 X 1.1 X 4.3 CM.    NO OTHER ABNORMALITIES ARE IDENTIFIED.  2.  Acid reflux - with choking  3. Wheat sensitivity - She had very high TPO antibodies, tongue burning, skin changes so I suggested celiac disease test at last visit.  IgA returned elevated, however, the celiac specific immunoglobulins were normal.  I advised her to discuss with PCP to see if further testing is needed.  - she had a food allergy test performed and she was  allergic to several foods including wheat  PLAN: 1. Hashimoto's hypothyroidism -Patient with Hashimoto's hypothyroidism, with very high TPO antibodies, not responding to selenium.  Currently off selenium.  We started levothyroxine but she developed burning of her tongue with the levothyroxine tablets so we switched to Tirosint in 2020.  However, the tongue burning sensation continued.  Reviewing her medication list, she was on minocycline and we discussed that this could cause hypothyroidism and also her burning tongue.  She stopped the medication and symptoms improved.  She has several food allergies which could have contributed, also.  She was able to switch to levothyroxine tablets with good tolerance afterwards. - latest thyroid labs reviewed with pt. >> normal: Lab Results  Component Value Date   TSH 2.97 09/14/2022  - she continues on LT4 50 mcg daily - pt feels good on this dose but is unhappy about gaining 5 pounds since last visit.  She would like to  lose 10 pounds.  We discussed about the need to change the diet as we age as her metabolism slows down.  We may need to increase her levothyroxine dose if the TSH is higher in the normal range. - we discussed about taking the thyroid hormone every day, with water, >30 minutes before breakfast, separated by >4 hours from acid reflux medications, calcium, iron, multivitamins. Pt. is taking it correctly.  We discussed about taking 2 tablets the next day if she forgets to take levothyroxine one day - will check thyroid tests today: TSH and fT4 - If labs are abnormal, she will need to return for repeat TFTs in 1.5 months - OTW, I will  see her back in a year  2.  Acid reflux -She describes choking after meals and also acid reflux for which she takes over-the-counter medications (she does not remember the name).  She did not not have an endoscopy.  Since the acid reflux appears after foods that are not known to trigger this like ice cream or yogurt  and also after exercise, we discussed about possibly having an endoscopy to clarify the etiology.  She agrees with this.  Will discuss with PCP.  Needs refills.  Orders Placed This Encounter  Procedures   TSH   T4, free   Component     Latest Ref Rng 09/10/2023  T4,Free(Direct)     0.8 - 1.8 ng/dL 1.6   TSH     3.08 - 6.57 mIU/L 3.75   TSH is higher.  We can increase the dose of levothyroxine to 75 mcg daily and recheck her tests in 1.5 months.  Carlus Pavlov, MD PhD [facility]

## 2023-09-10 NOTE — Addendum Note (Signed)
 Addended by: Carlus Pavlov on: 09/10/2023 05:00 PM   Modules accepted: Orders

## 2023-09-10 NOTE — Patient Instructions (Signed)
Please continue Levothyroxine 50 mcg daily.  Take the thyroid hormone every day, with water, at least 30 minutes before breakfast, separated by at least 4 hours from: - acid reflux medications - calcium - iron - multivitamins  Please stop at the lab.  Please come back for a follow-up appointment in 1 year. 

## 2023-09-11 ENCOUNTER — Other Ambulatory Visit: Payer: Self-pay | Admitting: Obstetrics and Gynecology

## 2023-09-11 DIAGNOSIS — Z1231 Encounter for screening mammogram for malignant neoplasm of breast: Secondary | ICD-10-CM

## 2023-10-15 ENCOUNTER — Ambulatory Visit (INDEPENDENT_AMBULATORY_CARE_PROVIDER_SITE_OTHER): Payer: BC Managed Care – PPO | Admitting: Obstetrics and Gynecology

## 2023-10-15 ENCOUNTER — Encounter: Payer: Self-pay | Admitting: Obstetrics and Gynecology

## 2023-10-15 VITALS — BP 146/92 | HR 66 | Resp 14 | Ht 66.0 in | Wt 151.0 lb

## 2023-10-15 DIAGNOSIS — Z01419 Encounter for gynecological examination (general) (routine) without abnormal findings: Secondary | ICD-10-CM | POA: Diagnosis not present

## 2023-10-15 DIAGNOSIS — R350 Frequency of micturition: Secondary | ICD-10-CM

## 2023-10-15 DIAGNOSIS — Z1331 Encounter for screening for depression: Secondary | ICD-10-CM | POA: Diagnosis not present

## 2023-10-15 NOTE — Assessment & Plan Note (Signed)
 Cervical cancer screening performed according to ASCCP guidelines. Encouraged annual mammogram screening Colonoscopy UTD DXA N/A Labs and immunizations with her primary Encouraged safe sexual practices as indicated Encouraged healthy lifestyle practices with diet and exercise For patients under 61yo, I recommend 1000mg  calcium daily and 600IU of vitamin D daily.

## 2023-10-15 NOTE — Progress Notes (Signed)
 63 y.o. [id number] postmenopausal female here for annual exam. Married. [employer]our whole sale. Husband had prostate cancer.  Patient's last menstrual period was 05/06/2014.   Notes urinary frequency/urgency. Voids ~8x/day, Nocturia 1-2x/night. Take diuertic in mornings Has coffee in morning and Dr. Kathlene Paradise for lunch  Abnormal bleeding: none Pelvic discharge or pain: none Breast mass, nipple discharge or skin changes : none Last PAP:     Component Value Date/Time   DIAGPAP  09/26/2021 0844    - Negative for intraepithelial lesion or malignancy (NILM)   ADEQPAP  09/26/2021 0844    Satisfactory for evaluation; transformation zone component PRESENT.   Last mammogram: 10/25/22 BI-RADS 1, density B Last colonoscopy: 06/2022 Sexually active: yes  Exercising: yes, pilates, walking, pickleball, golf; feels she could improve her diet Smoker: never   [employer] Visit from 10/15/2023 in [facility]  PHQ-2 Total Score 0        GYN HISTORY: Endometrial ablation Ovarian mass, 2009, stable on last US  in 2020 (1.9cm, avascular)  OB History  Gravida Para Term Preterm AB Living  2 2 2   0 2  SAB IAB Ectopic Multiple Live Births  0  0  2    # Outcome Date GA Lbr Len/2nd Weight Sex Type Anes PTL Lv  2 Term           1 Term             Past Medical History:  Diagnosis Date   Elevated cholesterol    Hypertension    Ovarian mass 06/2005   RIGHT OVARIAN ECHOGENIC MASS   S/P endometrial ablation 03/2006   Thyroid  disease    Hypo thyroid     Past Surgical History:  Procedure Laterality Date   COLONOSCOPY  04/03/2012   Procedure: COLONOSCOPY;  Surgeon: Celedonio Coil, MD;  Location: [facility];  Service: Endoscopy;  Laterality: N/A;   ENDOMETRIAL ABLATION  04/18/2006   HIP SURGERY  08/2004   LEFT HIP REPLACEMENT   HYSTEROSCOPY  04/18/2006   HYST, D&C WITH NOVASURE   JOINT REPLACEMENT     left hip   PILONIDAL CYST DRAINAGE     Removed     Current Outpatient Medications on File Prior to Visit  Medication Sig Dispense Refill   amLODipine  (NORVASC ) 5 MG tablet TAKE 1 TABLET (5 MG TOTAL) BY MOUTH IN THE MORNING AND AT BEDTIME 180 tablet 3   Chelated Zinc 50 MG TABS      Cholecalciferol (VITAMIN D3 PO) Take 2,000 Int'l Units by mouth.     Clindamycin-Benzoyl Per, Refr, gel Apply 1 application  topically as directed.     levothyroxine  (SYNTHROID ) 75 MCG tablet Take 1 tablet (75 mcg total) by mouth daily. 45 tablet 5   meloxicam (MOBIC) 15 MG tablet Take 15 mg by mouth as needed.     Multiple Vitamin (MULTI VITAMIN DAILY PO) Take by mouth.     rosuvastatin  (CRESTOR ) 10 MG tablet TAKE 1 TABLET BY MOUTH EVERY DAY 90 tablet 2   triamterene -hydrochlorothiazide  (MAXZIDE-25) 37.5-25 MG tablet TAKE 1 TABLET BY MOUTH EVERY DAY 90 tablet 0   No current facility-administered medications on file prior to visit.    Social History   Socioeconomic History   Marital status: Married    Spouse name: Not on file   Number of children: Not on file   Years of education: Not on file   Highest education level: Not on file  Occupational History   Not  on file  Tobacco Use   Smoking status: Never   Smokeless tobacco: Never  Vaping Use   Vaping status: Never Used  Substance and Sexual Activity   Alcohol use: Yes    Alcohol/week: 10.0 standard drinks of alcohol    Types: 10 Standard drinks or equivalent per week   Drug use: No   Sexual activity: Yes    Birth control/protection: Other-see comments, Post-menopausal    Comment: Vasectomy-1st intercourse 63 yo-Fewer than 5 partners  Other Topics Concern   Not on file  Social History Narrative   Not on file   Social Drivers of Health   Financial Resource Strain: Not on file  Food Insecurity: Not on file  Transportation Needs: Not on file  Physical Activity: Not on file  Stress: Not on file  Social Connections: Not on file  Intimate Partner Violence: Not on file    Family History   Problem Relation Age of Onset   Hypertension Mother    Lung cancer Mother    Heart disease Father    Cancer Father        Throat,Lymphoma   Diabetes Maternal Grandmother    Heart disease Maternal Grandfather   Her parents have passed.   Allergies  Allergen Reactions   Lisinopril Swelling    Swelling of lips      PE Today's Vitals   10/15/23 1121  BP: (!) 146/92  Pulse: 66  Resp: 14  Weight: 151 lb (68.5 kg)  Height: 5\' 6"  (1.676 m)   Body mass index is 24.37 kg/m.  Physical Exam Vitals reviewed. Exam conducted with a chaperone present.  Constitutional:      General: She is not in acute distress.    Appearance: Normal appearance.  HENT:     Head: Normocephalic and atraumatic.     Nose: Nose normal.  Eyes:     Extraocular Movements: Extraocular movements intact.     Conjunctiva/sclera: Conjunctivae normal.  Neck:     Thyroid : No thyroid  mass, thyromegaly or thyroid  tenderness.  Pulmonary:     Effort: Pulmonary effort is normal.  Chest:     Chest wall: No mass or tenderness.  Breasts:    Right: Normal. No swelling, mass, nipple discharge, skin change or tenderness.     Left: Normal. No swelling, mass, nipple discharge, skin change or tenderness.  Abdominal:     General: There is no distension.     Palpations: Abdomen is soft.     Tenderness: There is no abdominal tenderness.  Genitourinary:    General: Normal vulva.     Exam position: Lithotomy position.     Urethra: No prolapse.     Vagina: Normal. No vaginal discharge or bleeding.     Cervix: Normal. No lesion.     Uterus: Normal. Not enlarged and not tender.      Adnexa: Right adnexa normal and left adnexa normal.  Musculoskeletal:        General: Normal range of motion.     Cervical back: Normal range of motion.  Lymphadenopathy:     Upper Body:     Right upper body: No axillary adenopathy.     Left upper body: No axillary adenopathy.     Lower Body: No right inguinal adenopathy. No left  inguinal adenopathy.  Skin:    General: Skin is warm and dry.  Neurological:     General: No focal deficit present.     Mental Status: She is alert.  Psychiatric:  Mood and Affect: Mood normal.        Behavior: Behavior normal.      Assessment and Plan:        Well woman exam with routine gynecological exam Assessment & Plan: Cervical cancer screening performed according to ASCCP guidelines. Encouraged annual mammogram screening Colonoscopy UTD DXA N/A Labs and immunizations with her primary Encouraged safe sexual practices as indicated Encouraged healthy lifestyle practices with diet and exercise For patients under 50yo, I recommend 1000mg  calcium  daily and 600IU of vitamin D  daily.    Negative depression screening  Urinary frequency -     Urinalysis,Complete w/RFL Culture  Patient with OAB symptoms. Education on cause provided. Discussed lifestyle modifications for management, including avoidance of fluid prior to bed and bladder irritants. Also taking hydrochlorothiazide  for HTN. Consider OAB meds if unimproved with lifestyle modifications.  Romaine Closs, MD

## 2023-10-15 NOTE — Patient Instructions (Signed)

## 2023-10-16 ENCOUNTER — Encounter: Payer: Self-pay | Admitting: Obstetrics and Gynecology

## 2023-10-16 LAB — URINALYSIS, COMPLETE W/RFL CULTURE
Bacteria, UA: NONE SEEN /HPF
Bilirubin Urine: NEGATIVE
Glucose, UA: NEGATIVE
Hgb urine dipstick: NEGATIVE
Hyaline Cast: NONE SEEN /LPF
Ketones, ur: NEGATIVE
Leukocyte Esterase: NEGATIVE
Nitrites, Initial: NEGATIVE
Protein, ur: NEGATIVE
RBC / HPF: NONE SEEN /HPF (ref 0–2)
Specific Gravity, Urine: 1.015 (ref 1.001–1.035)
WBC, UA: NONE SEEN /HPF (ref 0–5)
pH: 7.5 (ref 5.0–8.0)

## 2023-10-16 LAB — NO CULTURE INDICATED

## 2023-10-23 ENCOUNTER — Other Ambulatory Visit

## 2023-10-23 DIAGNOSIS — E063 Autoimmune thyroiditis: Secondary | ICD-10-CM | POA: Diagnosis not present

## 2023-10-23 LAB — T3, FREE: T3, Free: 3.3 pg/mL (ref 2.3–4.2)

## 2023-10-23 LAB — T4, FREE: Free T4: 1.7 ng/dL (ref 0.8–1.8)

## 2023-10-23 LAB — TSH: TSH: 1.78 m[IU]/L (ref 0.40–4.50)

## 2023-10-24 ENCOUNTER — Other Ambulatory Visit: Payer: Self-pay | Admitting: Internal Medicine

## 2023-10-24 MED ORDER — LEVOTHYROXINE SODIUM 75 MCG PO TABS: 75.0000 ug | ORAL_TABLET | Freq: Every day | ORAL | 3 refills | Status: AC

## 2023-10-29 ENCOUNTER — Ambulatory Visit
Admission: RE | Admit: 2023-10-29 | Discharge: 2023-10-29 | Disposition: A | Discharge status: Home / Self Care | Source: Ambulatory Visit | Attending: Obstetrics and Gynecology

## 2023-10-29 DIAGNOSIS — Z1231 Encounter for screening mammogram for malignant neoplasm of breast: Secondary | ICD-10-CM

## 2023-10-31 ENCOUNTER — Ambulatory Visit: Payer: Self-pay | Admitting: Obstetrics and Gynecology

## 2023-11-10 DIAGNOSIS — H5789 Other specified disorders of eye and adnexa: Secondary | ICD-10-CM | POA: Diagnosis not present

## 2023-11-16 DIAGNOSIS — H1031 Unspecified acute conjunctivitis, right eye: Secondary | ICD-10-CM | POA: Diagnosis not present

## 2023-11-18 ENCOUNTER — Other Ambulatory Visit: Payer: Self-pay | Admitting: Internal Medicine

## 2023-12-22 ENCOUNTER — Other Ambulatory Visit: Payer: Self-pay | Admitting: Internal Medicine

## 2023-12-24 ENCOUNTER — Other Ambulatory Visit: Payer: Self-pay

## 2023-12-24 DIAGNOSIS — I10 Essential (primary) hypertension: Secondary | ICD-10-CM

## 2023-12-24 DIAGNOSIS — Z79899 Other long term (current) drug therapy: Secondary | ICD-10-CM

## 2023-12-24 DIAGNOSIS — E785 Hyperlipidemia, unspecified: Secondary | ICD-10-CM

## 2023-12-26 NOTE — Progress Notes (Unsigned)
 Cardiology Office Note   Date:  12/26/2023   ID:  Mary Hughes, DOB [birth year], MRN [id number]  PCP:  Okey Vina GAILS, MD  Cardiologist:   Vina Okey, MD   Pt presents for continued cardiac care of HTN, CAD      History of Present Illness: Mary Hughes is a 63 y.o. female with a history of HTN, MR, CAD (CT calcium  score of 6.1 (71st percentile in 2023) GERD and and Hashimoto's hyothyoroidsim   Echo was done in Feb 2021 showed mild MR      I last saw the pt in clinic in April 2023   SInce seen she has done OK from a cardiac standpoint  Breathing is OK  She denies CP    Notes her heart going faster at times in bed Brief.  She also says she feels thirsty all the time   ADmits she probably has to drink more water   I saw the pt in May 2024  No outpatient medications have been marked as taking for the 12/27/23 encounter (Appointment) with Okey Vina GAILS, MD.     Allergies:   Lisinopril   Past Medical History:  Diagnosis Date   Elevated cholesterol    Hypertension    Ovarian mass 06/2005   RIGHT OVARIAN ECHOGENIC MASS   S/P endometrial ablation 03/2006   Thyroid  disease    Hypo thyroid     Past Surgical History:  Procedure Laterality Date   COLONOSCOPY  04/03/2012   Procedure: COLONOSCOPY;  Surgeon: Lesta JULIANNA Fitz, MD;  Location: [facility];  Service: Endoscopy;  Laterality: N/A;   ENDOMETRIAL ABLATION  04/18/2006   HIP SURGERY  08/2004   LEFT HIP REPLACEMENT   HYSTEROSCOPY  04/18/2006   HYST, D&C WITH NOVASURE   JOINT REPLACEMENT     left hip   PILONIDAL CYST DRAINAGE     Removed     Social History:  The patient  reports that she has never smoked. She has never used smokeless tobacco. She reports current alcohol use of about 10.0 standard drinks of alcohol per week. She reports that she does not use drugs.   Family History:  The patient's family history includes Cancer in her father; Diabetes in her maternal grandmother; Heart disease in her father and maternal  grandfather; Hypertension in her mother; Lung cancer in her mother.    ROS:  Please see the history of present illness. All other systems are reviewed and  Negative to the above problem except as noted.    PHYSICAL EXAM: VS:  LMP 05/06/2014 Comment: sexually active, husband vasectomy  GEN: Well nourished, well developed, in no acute distress  HEENT: normal  Neck: no JVD, carotid bruit Cardiac: RRR; no murmurs  No LE edema  Respiratory:  clear to auscultation GI: soft, nontender,  No hepatomegaly  MS: no deformity    EKG:  EKG is ordered today.  SR 86 bpm   Echo   07/22/19  1. Left ventricular ejection fraction, by visual estimation, is 60 to 65%. The left ventricle has normal function. There is no left ventricular hypertrophy. 2. Global right ventricle has normal systolic function.The right ventricular size is normal. No increase in right ventricular wall thickness. 3. Left atrial size was normal. 4. Right atrial size was normal. 5. The mitral valve is abnormal. Mild mitral valve regurgitation. No evidence of mitral stenosis. Late systolic bowing without frank prolapse. 6. The tricuspid valve is normal in structure. 7. The tricuspid valve is normal  in structure. Tricuspid valve regurgitation is trivial. 8. The aortic valve is normal in structure. Aortic valve regurgitation is not visualized. No evidence of aortic valve sclerosis or stenosis. 9. The pulmonic valve was normal in structure. Pulmonic valve regurgitation is trivial. 10. Normal pulmonary artery systolic pressure. 11. The inferior vena cava is normal in size with greater than 50% respiratory variability, suggesting right atrial pressure of 3 mmHg. 12. Left ventricular diastolic parameters are consistent with Grade I diastolic dysfunction (impaired relaxation). 13. The left ventricle has no regional wall motion abnormalities.  Lipid Panel    Component Value Date/Time   CHOL 201 (H) 08/09/2020 0845   TRIG 57  08/09/2020 0845   HDL 85 08/09/2020 0845   CHOLHDL 2.4 08/09/2020 0845   CHOLHDL 2.4 01/18/2015 0930   VLDL 12 01/18/2015 0930   LDLCALC 105 (H) 08/09/2020 0845      Wt Readings from Last 3 Encounters:  10/15/23 151 lb (68.5 kg)  09/10/23 151 lb 6.4 oz (68.7 kg)  10/26/22 147 lb 9.6 oz (67 kg)      ASSESSMENT AND PLAN:  1.  HTN  BP is excellent  Given thirst, I would recommend a  trial of cutting back on Maxzide to 1/2 tab per day   Follow BP and thirst      Check labs today   2    CAD  Found on Calcium  score CT   Score was 6.1.   (Aug 2023) Pt without concerning symptoms    Rx BP and lipids   Watch diet   3 HL    WIll check lipomed and liver panel today    4 Diet   Now gluten free  REviewed Mediterranean diet.   Minimally processed foods     5  Hashimoto's    Followed in endocrine    6  Allergies   Angioedema with lisinopril     Plan for f/u in 1 yrear  Current medicines are reviewed at length with the patient today.  The patient does not have concerns regarding medicines.  Signed, Vina Gull, MD  12/26/2023 9:24 AM    [facility] [address] [place], [place], [place]  [address] Phone: [phone]; Fax: [phone]

## 2023-12-27 ENCOUNTER — Encounter: Payer: Self-pay | Admitting: Internal Medicine

## 2023-12-27 ENCOUNTER — Other Ambulatory Visit: Payer: Self-pay | Admitting: Internal Medicine

## 2023-12-27 ENCOUNTER — Ambulatory Visit: Attending: Internal Medicine | Admitting: Internal Medicine

## 2023-12-27 VITALS — BP 134/90 | HR 79 | Ht 66.0 in | Wt 148.0 lb

## 2023-12-27 DIAGNOSIS — E785 Hyperlipidemia, unspecified: Secondary | ICD-10-CM | POA: Diagnosis not present

## 2023-12-27 DIAGNOSIS — Z79899 Other long term (current) drug therapy: Secondary | ICD-10-CM | POA: Diagnosis not present

## 2023-12-27 DIAGNOSIS — I10 Essential (primary) hypertension: Secondary | ICD-10-CM | POA: Diagnosis not present

## 2023-12-27 NOTE — Patient Instructions (Signed)
 Medication Instructions:  The current medical regimen is effective;  continue present plan and medications.  *If you need a refill on your cardiac medications before your next appointment, please call your pharmacy*  Lab Work: Please have blood work as ordered today.  If you have labs (blood work) drawn today and your tests are completely normal, you will receive your results only by: MyChart Message (if you have MyChart) OR A paper copy in the mail If you have any lab test that is abnormal or we need to change your treatment, we will call you to review the results.    Follow-Up: At [facility], you and your health needs are our priority.  As part of our continuing mission to provide you with exceptional heart care, our providers are all part of one team.  This team includes your primary Cardiologist (physician) and Advanced Practice Providers or APPs (Physician Assistants and Nurse Practitioners) who all work together to provide you with the care you need, when you need it.  Your next appointment:   1 year(s)  Provider:   Vina Gull, MD     We recommend signing up for the patient portal called MyChart.  Sign up information is provided on this After Visit Summary.  MyChart is used to connect with patients for Virtual Visits (Telemedicine).  Patients are able to view lab/test results, encounter notes, upcoming appointments, etc.  Non-urgent messages can be sent to your provider as well.   To learn more about what you can do with MyChart, go to [email or web address].

## 2023-12-28 ENCOUNTER — Encounter: Payer: Self-pay | Admitting: Internal Medicine

## 2023-12-28 ENCOUNTER — Ambulatory Visit: Payer: Self-pay | Admitting: *Deleted

## 2023-12-28 LAB — CBC
Hematocrit: 40.5 % (ref 34.0–46.6)
Hemoglobin: 13.6 g/dL (ref 11.1–15.9)
MCH: 32.2 pg (ref 26.6–33.0)
MCHC: 33.6 g/dL (ref 31.5–35.7)
MCV: 96 fL (ref 79–97)
Platelets: 291 x10E3/uL (ref 150–450)
RBC: 4.23 x10E6/uL (ref 3.77–5.28)
RDW: 13.1 % (ref 11.7–15.4)
WBC: 6.6 x10E3/uL (ref 3.4–10.8)

## 2023-12-28 LAB — NMR, LIPOPROFILE
Cholesterol, Total: 183 mg/dL (ref 100–199)
HDL Particle Number: 44.9 umol/L (ref >=30.5)
HDL-C: 94 mg/dL (ref >39)
LDL Particle Number: 613 nmol/L (ref <1000)
LDL Size: 21.3 nm (ref >20.5)
LDL-C (NIH Calc): 71 mg/dL (ref 0–99)
LP-IR Score: 29 (ref <=45)
Small LDL Particle Number: <90 nmol/L (ref <=527)
Triglycerides: 105 mg/dL (ref 0–149)

## 2023-12-28 LAB — BASIC METABOLIC PANEL WITH GFR
BUN/Creatinine Ratio: 21 (ref 12–28)
BUN: 17 mg/dL (ref 8–27)
CO2: 23 mmol/L (ref 20–29)
Calcium: 10 mg/dL (ref 8.7–10.3)
Chloride: 97 mmol/L (ref 96–106)
Creatinine, Ser: 0.81 mg/dL (ref 0.57–1.00)
Glucose: 89 mg/dL (ref 70–99)
Potassium: 3.4 mmol/L — ABNORMAL LOW (ref 3.5–5.2)
Sodium: 137 mmol/L (ref 134–144)
eGFR: 83 mL/min/1.73 (ref >59)

## 2023-12-28 LAB — HEMOGLOBIN A1C
Est. average glucose Bld gHb Est-mCnc: 108 mg/dL
Hgb A1c MFr Bld: 5.4 % (ref 4.8–5.6)

## 2023-12-28 MED ORDER — AMLODIPINE BESYLATE 5 MG PO TABS: 5.0000 mg | ORAL_TABLET | Freq: Every day | ORAL | 3 refills | Status: AC

## 2023-12-28 NOTE — Telephone Encounter (Signed)
 Yes, please make correction to Epic as noted by the patient in msg.   Hx can be tracked in chart for change

## 2023-12-29 ENCOUNTER — Other Ambulatory Visit: Payer: Self-pay | Admitting: Internal Medicine

## 2024-01-15 DIAGNOSIS — D225 Melanocytic nevi of trunk: Secondary | ICD-10-CM | POA: Diagnosis not present

## 2024-01-15 DIAGNOSIS — H02729 Madarosis of unspecified eye, unspecified eyelid and periocular area: Secondary | ICD-10-CM | POA: Diagnosis not present

## 2024-01-15 DIAGNOSIS — D2272 Melanocytic nevi of left lower limb, including hip: Secondary | ICD-10-CM | POA: Diagnosis not present

## 2024-01-15 DIAGNOSIS — L821 Other seborrheic keratosis: Secondary | ICD-10-CM | POA: Diagnosis not present

## 2024-01-16 ENCOUNTER — Telehealth: Payer: Self-pay | Admitting: Internal Medicine

## 2024-01-16 NOTE — Telephone Encounter (Signed)
 Spoke to patient    She would like to try Zepbound for wt loss I have spoken to CHRISTELLA Crews   It is not covered by insureance   Pt is aware of cost   Recomm 4 weeks 2.5 mg    Increase to 5 mg weeks 5 to 8  Will reassess at that time further increase to 7.5 or 10 based on how tolerating and wt

## 2024-01-18 NOTE — Telephone Encounter (Signed)
 Will route to Pharm[employer] for assistance with sending prescription.

## 2024-01-21 MED ORDER — ZEPBOUND 2.5 MG/0.5ML ~~LOC~~ SOLN: 2.5000 mg | SUBCUTANEOUS | 0 refills | Status: DC

## 2024-01-21 NOTE — Addendum Note (Signed)
 Addended by: Armida Vickroy D on: 01/21/2024 02:01 PM   Modules accepted: Orders

## 2024-01-21 NOTE — Telephone Encounter (Signed)
 Rx sent to [facility].

## 2024-02-13 ENCOUNTER — Encounter: Payer: Self-pay | Admitting: Internal Medicine

## 2024-02-13 MED ORDER — ZEPBOUND 5 MG/0.5ML ~~LOC~~ SOLN
5.0000 mg | SUBCUTANEOUS | 0 refills | Status: DC
Start: 2024-02-13 — End: 2024-03-05

## 2024-02-13 NOTE — Addendum Note (Signed)
 Addended by: DARRELL BRUCKNER on: 02/13/2024 11:32 AM   Modules accepted: Orders

## 2024-02-22 DIAGNOSIS — H35371 Puckering of macula, right eye: Secondary | ICD-10-CM | POA: Diagnosis not present

## 2024-02-22 DIAGNOSIS — H40023 Open angle with borderline findings, high risk, bilateral: Secondary | ICD-10-CM | POA: Diagnosis not present

## 2024-02-22 DIAGNOSIS — Z961 Presence of intraocular lens: Secondary | ICD-10-CM | POA: Diagnosis not present

## 2024-03-04 ENCOUNTER — Other Ambulatory Visit: Payer: Self-pay | Admitting: Internal Medicine

## 2024-03-05 ENCOUNTER — Other Ambulatory Visit: Payer: Self-pay | Admitting: Internal Medicine

## 2024-03-05 NOTE — Telephone Encounter (Signed)
 Spoke to patient, Baseline wt -150 lbs , lost around 12 lbs and keep losing weight on current dose so wanted to stay at Zepbound  5 mg dose.

## 2024-03-16 ENCOUNTER — Other Ambulatory Visit: Payer: Self-pay | Admitting: Internal Medicine

## 2024-03-28 DIAGNOSIS — Z23 Encounter for immunization: Secondary | ICD-10-CM | POA: Diagnosis not present

## 2024-03-28 DIAGNOSIS — Z0000 Encounter for general adult medical examination without abnormal findings: Secondary | ICD-10-CM | POA: Diagnosis not present

## 2024-03-31 ENCOUNTER — Other Ambulatory Visit: Payer: Self-pay | Admitting: Internal Medicine

## 2024-05-12 ENCOUNTER — Encounter: Payer: Self-pay | Admitting: Internal Medicine

## 2024-05-12 MED ORDER — ZEPBOUND 2.5 MG/0.5ML ~~LOC~~ SOLN: 2.5000 mg | SUBCUTANEOUS | 5 refills | Status: AC

## 2024-09-08 ENCOUNTER — Ambulatory Visit: Admitting: Internal Medicine
